# Patient Record
Sex: Male | Born: 1995 | Race: Black or African American | Hispanic: No | Marital: Single | Smoking: Never smoker
Health system: Southern US, Community
[De-identification: ages and names within clinical notes are randomized; demographics above are authoritative.]

## PROBLEM LIST (undated history)

## (undated) DIAGNOSIS — R1011 Right upper quadrant pain: Secondary | ICD-10-CM

## (undated) DIAGNOSIS — F32A Depression, unspecified: Secondary | ICD-10-CM

## (undated) DIAGNOSIS — R55 Syncope and collapse: Secondary | ICD-10-CM

## (undated) DIAGNOSIS — J45909 Unspecified asthma, uncomplicated: Secondary | ICD-10-CM

## (undated) HISTORY — DX: Depression, unspecified: F32.A

## (undated) HISTORY — DX: Right upper quadrant pain: R10.11

## (undated) HISTORY — PX: WISDOM TOOTH EXTRACTION: SHX21

## (undated) HISTORY — DX: Unspecified asthma, uncomplicated: J45.909

## (undated) HISTORY — PX: NO PAST SURGERIES: SHX2092

## (undated) HISTORY — DX: Syncope and collapse: R55

---
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
## 2016-01-17 ENCOUNTER — Ambulatory Visit: Payer: Medicaid Other | Admitting: Internal Medicine

## 2016-01-26 ENCOUNTER — Other Ambulatory Visit: Payer: Self-pay | Admitting: Primary Care

## 2016-01-26 DIAGNOSIS — I498 Other specified cardiac arrhythmias: Secondary | ICD-10-CM

## 2016-01-31 ENCOUNTER — Ambulatory Visit (INDEPENDENT_AMBULATORY_CARE_PROVIDER_SITE_OTHER): Payer: No Typology Code available for payment source | Admitting: Internal Medicine

## 2016-01-31 ENCOUNTER — Encounter: Payer: Self-pay | Admitting: Internal Medicine

## 2016-01-31 VITALS — BP 116/70 | HR 50 | Ht 69.0 in | Wt 203.0 lb

## 2016-01-31 DIAGNOSIS — R079 Chest pain, unspecified: Secondary | ICD-10-CM | POA: Diagnosis not present

## 2016-01-31 NOTE — Progress Notes (Signed)
      HPI Billy Shropshirerevon returns today to followup syncope. 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 When I saw him last, he was having RUQ pain and an U/S of the Gall bladder did not demonstrate any stones. In the interim, he notes a couple of spells where he felt like he might pass out but did not. He would lie down and the episode would pass.  No Known Allergies   Current Outpatient Prescriptions  Medication Sig Dispense Refill  . diclofenac (VOLTAREN) 0.1 % ophthalmic solution 4 (four) times daily.    Marland Kitchen. ibuprofen (ADVIL,MOTRIN) 200 MG tablet Take 400 mg by mouth every 6 (six) hours as needed for mild pain. Reported on 11/02/2015     No current facility-administered medications for this visit.     Past Medical History  Diagnosis Date  . Syncope 10/18/2015  . Right upper quadrant pain 10/18/2015    ROS:   All systems reviewed and negative except as noted in the HPI.   Past Surgical History  Procedure Laterality Date  . No past surgeries       No family history on file.   Social History   Social History  . Marital Status: Single    Spouse Name: N/A  . Number of Children: N/A  . Years of Education: N/A   Occupational History  . Not on file.   Social History Main Topics  . Smoking status: Never Smoker   . Smokeless tobacco: Not on file  . Alcohol Use: No  . Drug Use: No  . Sexual Activity: Not on file   Other Topics Concern  . Not on file   Social History Narrative     Ht 5\' 9"  (1.753 m)  Wt 203 lb (92.08 kg)  BMI 29.96 kg/m2  Physical Exam:  Well appearing NAD HEENT: Unremarkable Neck:  No JVD, no thyromegally Lymphatics:  No adenopathy Back:  No CVA tenderness Lungs:  Clear with no wheezes. HEART:  Regular rate rhythm, no murmurs, no rubs, no clicks Abd:  soft, positive bowel sounds, no organomegally, no rebound, no guarding Ext:  2 plus pulses, no edema, no cyanosis, no clubbing Skin:  No rashes  no nodules Neuro:  CN II through XII intact, motor grossly intact  EKG - NSR with LVH  Assess/Plan: 1. Syncope - he appears to be stable. I discussed the benign nature of his symptoms and recommended he continue his increased salt and fluid. I will see him back in a year. 2. Chest pain - his symptoms are atypical. He will undergo watchful waiting. 3. Right upper quadrant pain - his u/s is notable for no stones. His mother tells me he is pending a CT scan.  Leonia ReevesGregg Carrieanne Turner,M.D.

## 2016-01-31 NOTE — Patient Instructions (Signed)
Medication Instructions:  Your physician recommends that you continue on your current medications as directed. Please refer to the Current Medication list given to you today.   Labwork: None ordered None ordered   Testing/Procedures:  None ordered   Follow-Up:  Your physician wants you to follow-up in: 12 months with Dr Court Joyaylor You will receive a reminder letter in the mail two months in advance. If you don't receive a letter, please call our office to schedule the follow-up appointment.   Any Other Special Instructions Will Be Listed Below (If Applicable).     If you need a refill on your cardiac medications before your next appointment, please call your pharmacy.

## 2016-02-02 ENCOUNTER — Ambulatory Visit
Admission: RE | Admit: 2016-02-02 | Discharge: 2016-02-02 | Disposition: A | Discharge status: Home / Self Care | Payer: No Typology Code available for payment source | Source: Ambulatory Visit | Attending: Primary Care | Admitting: Primary Care

## 2016-02-02 DIAGNOSIS — I498 Other specified cardiac arrhythmias: Secondary | ICD-10-CM

## 2016-02-02 MED ORDER — IOPAMIDOL (ISOVUE-300) INJECTION 61%
125.0000 mL | Freq: Once | INTRAVENOUS | Status: AC | PRN
  Administered 2016-02-02: 125 mL via INTRAVENOUS

## 2016-02-07 ENCOUNTER — Ambulatory Visit (INDEPENDENT_AMBULATORY_CARE_PROVIDER_SITE_OTHER): Payer: No Typology Code available for payment source | Admitting: Internal Medicine

## 2016-02-07 ENCOUNTER — Encounter: Payer: Self-pay | Admitting: Internal Medicine

## 2016-02-07 VITALS — BP 111/56 | HR 57 | Temp 97.6°F | Ht 69.0 in | Wt 203.1 lb

## 2016-02-07 DIAGNOSIS — Z8679 Personal history of other diseases of the circulatory system: Secondary | ICD-10-CM | POA: Diagnosis not present

## 2016-02-07 DIAGNOSIS — R55 Syncope and collapse: Secondary | ICD-10-CM

## 2016-02-07 DIAGNOSIS — R1011 Right upper quadrant pain: Secondary | ICD-10-CM

## 2016-02-07 DIAGNOSIS — Z09 Encounter for follow-up examination after completed treatment for conditions other than malignant neoplasm: Secondary | ICD-10-CM

## 2016-02-07 MED ORDER — NAPROXEN 500 MG PO TABS: 500.0000 mg | ORAL_TABLET | Freq: Two times a day (BID) | ORAL | Status: DC

## 2016-02-07 MED ORDER — DICLOFENAC SODIUM 1 % TD GEL: 2.0000 g | Freq: Four times a day (QID) | TRANSDERMAL | Status: DC

## 2016-02-07 NOTE — Patient Instructions (Signed)
Your back pain seems most consistent with inflammation of the muscles connecting to your ribs along your back and side. Taking a strong dose of anti-inflammatory medications would hopefully reduce the irritation of these muscles and the associated pain.  Take naproxen 500mg  twice daily for at least 2-3 weeks and see if there is significant improvement in your symptoms. It may take a few days to have effect.  You may also continue using topical treatments with heat, ice, or topical voltaren for symptom relief in the meantime.  If your symptoms are not adequately controlled within a few weeks we will need to reassess and may start a different management plan.

## 2016-02-07 NOTE — Progress Notes (Signed)
   Subjective:   Patient ID: Billy Turner male   DOB: 1996/03/28 19 y.o.   MRN: [id number]  HPI: [name] is a 20 y.o. man with a history of right sided chest pain aand vasovagal syncope presenting for evaluation of his continued pain. His symptoms have been present intermittently for about 2-3 years. He denies any associated traumatic event with the onset of his pain. It is exacerbated by postural changes and deep inspiration. It is partially relieved with voltaren gel, ice, wearing a brace, or taking a nap. The pain is not so severe that it prevents daily activities and athletics.  See problem based assessment and plan below for additional details.  Past Medical History  Diagnosis Date  . Syncope 10/18/2015  . Right upper quadrant pain 10/18/2015   Current Outpatient Prescriptions  Medication Sig Dispense Refill  . diclofenac sodium (VOLTAREN) 1 % GEL Apply 2 g topically 4 (four) times daily. As needed. 100 g 2  . naproxen (NAPROSYN) 500 MG tablet Take 1 tablet (500 mg total) by mouth 2 (two) times daily with a meal. 60 tablet 2   No current facility-administered medications for this visit.   No family history on file. Social History   Social History  . Marital Status: Single    Spouse Name: N/A  . Number of Children: N/A  . Years of Education: N/A   Social History Main Topics  . Smoking status: Never Smoker   . Smokeless tobacco: None  . Alcohol Use: No  . Drug Use: No  . Sexual Activity: Not Asked   Other Topics Concern  . None   Social History Narrative   Review of Systems: Review of Systems  Constitutional: Positive for weight loss.  Respiratory: Negative for shortness of breath.   Cardiovascular: Positive for chest pain.  Gastrointestinal: Negative for nausea, vomiting, diarrhea and constipation.  Musculoskeletal: Positive for myalgias. Negative for falls.  Skin: Negative for rash.  Psychiatric/Behavioral: The patient does not have insomnia.       Objective:  Physical Exam: Filed Vitals:   02/07/16 0930  BP: 111/56  Pulse: 57  Temp: 97.6 F (36.4 C)  TempSrc: Oral  Height: 5\' 9"  (1.753 m)  Weight: 203 lb 1.6 oz (92.126 kg)  SpO2: 100%   GENERAL- alert, co-operative, NAD CARDIAC- RRR, no murmurs, rubs or gallops. RESP- CTAB, no wheezes or crackles. CHEST WALL- Tenderness to palpation over right lower costal border from vertebra extending around to midclavicular line, no skin change, no deformity ABDOMEN- Soft, nontender BACK- Right flank tenderness, paraspinal tenderness on right from about T8-L2, no deformity EXTREMITIES- slight tenderness on full active ROM of left knee, strength 5/5 SKIN- Warm, dry, No rash or lesion. PSYCH- Normal mood and affect, appropriate thought content and speech.  Assessment & Plan:

## 2016-02-08 NOTE — Assessment & Plan Note (Addendum)
Assessment: Pain is actually located more over the lower costal border and seems consistent with musculoskeletal pain. Seems very chronic by history. It is readily reproducible on physical exam. There was no tenderness over RUQ of the abdomen on my exam today. The right flank is tender superficially. He has had recent radiography with abdominal CT, chest xray, rib xray earlier this year all showing no abnormalities. He previously did physical therapy and thinks it did not help much and was not interested in referral today.  Plan: Naproxen 500mg  BID schedule 2-3 weeks Voltaren gel topical PRN Recommend continued stretching as tolerable Consider muscle relaxants, PT if symptoms are not improved at follow up

## 2016-02-08 NOTE — Assessment & Plan Note (Signed)
Reports no episodes since November 2016. He is consuming more electrolytes and fluid with exercise than before.

## 2016-02-12 NOTE — Progress Notes (Signed)
Case discussed with Dr. Rice at the time of the visit.  We reviewed the resident's history and exam and pertinent patient test results.  I agree with the assessment, diagnosis, and plan of care documented in the resident's note. 

## 2016-03-28 ENCOUNTER — Ambulatory Visit (INDEPENDENT_AMBULATORY_CARE_PROVIDER_SITE_OTHER): Payer: No Typology Code available for payment source | Admitting: Internal Medicine

## 2016-03-28 ENCOUNTER — Encounter: Payer: Self-pay | Admitting: Internal Medicine

## 2016-03-28 VITALS — BP 112/56 | HR 58 | Temp 98.2°F | Wt 202.5 lb

## 2016-03-28 DIAGNOSIS — M791 Myalgia: Secondary | ICD-10-CM | POA: Diagnosis not present

## 2016-03-28 DIAGNOSIS — M7918 Myalgia, other site: Secondary | ICD-10-CM

## 2016-03-28 NOTE — Patient Instructions (Signed)
Thank you for coming to see me today. It was a pleasure. Today we talked about:   Your ribcage and back pain: I am going to refer you to physical medicine and rehab.  Please also consider doing some alternative treatments such as yoga or tai chi or acupuncture or meditation.  Please follow-up with [place] in 1 year  If you have any questions or concerns, please do not hesitate to call the office at [phone].  Take Care,   Jule Ser, DO

## 2016-03-29 NOTE — Progress Notes (Signed)
Internal Medicine Clinic Attending  Case discussed with Dr. Wallace at the time of the visit.  We reviewed the resident's history and exam and pertinent patient test results.  I agree with the assessment, diagnosis, and plan of care documented in the resident's note.  

## 2016-03-29 NOTE — Assessment & Plan Note (Signed)
Assessment: Patient continues to have pain.  It very much appears to be more MSK related vs RUQ pain Although he did elicit some discomfort on palpation of his RUQ, the pain seemed to be more along his ribs as well as tenderness of the paraspinal muscles in the thoracolumbar region.  He has had a fairly extensive work-up thus far including imaging with abdominal CT, CXR, rib x-ray, abdominal ultrasound this year that did not show an abnormality.  His labwork has also been unremarkable.  He says the Naproxen and Voltaren do not really help.  He will occasionally do the stretching exercises taught to him by physical therapy but states they exacerbate his pain.  He tells me he works in [employer] and is still able to work.  He also continues to be able to play basketball regularly.  Plan: - discussed with patient to continue PT exercises as tolerated - discussed alternative treatment options (i.e. Yoga, Tai-Chi, Acupuncture) - refer to PM&R - d/c naproxen and voltaren as not really helping per patient

## 2016-03-29 NOTE — Progress Notes (Signed)
Patient ID: Billy Turner, male   DOB: 03-May-1996, 20 y.o.   MRN: [id number]   Subjective:   Patient ID: Billy Turner male   DOB: [birth year] 20 y.o.   MRN: [id number]  HPI: [name] is a 20 y.o. man with PMHx as below presenting for follow up of his RUQ pain.  Please see A&P for status of his chronic medical conditions.    Past Medical History  Diagnosis Date  . Syncope 10/18/2015  . Right upper quadrant pain 10/18/2015   No current outpatient prescriptions on file.   No current facility-administered medications for this visit.   No family history on file. Social History   Social History  . Marital Status: Single    Spouse Name: N/A  . Number of Children: N/A  . Years of Education: N/A   Social History Main Topics  . Smoking status: Never Smoker   . Smokeless tobacco: None  . Alcohol Use: No  . Drug Use: No  . Sexual Activity: Not Asked   Other Topics Concern  . None   Social History Narrative   Review of Systems: Review of Systems  Constitutional: Negative for fever and chills.  Respiratory: Negative for cough.   Cardiovascular: Negative for chest pain.  Gastrointestinal: Positive for abdominal pain (RUQ).  Genitourinary: Negative for dysuria and flank pain.  Musculoskeletal: Positive for back pain.  Psychiatric/Behavioral: Negative for depression. The patient is not nervous/anxious.     Objective:  Physical Exam: Filed Vitals:   03/28/16 1426  BP: 112/56  Pulse: 58  Temp: 98.2 F (36.8 C)  TempSrc: Oral  Weight: 202 lb 8 oz (91.853 kg)  SpO2: 100%   Physical Exam  Constitutional: He is oriented to person, place, and time and well-developed, well-nourished, and in no distress.  Pulmonary/Chest: Effort normal.  Abdominal: Soft. Bowel sounds are normal. He exhibits no distension. There is tenderness (mild over RUQ).  Musculoskeletal: Normal range of motion. He exhibits tenderness (he is tender superficially along right ribs as well as  paraspinal tenderness of the thoracolumbar region).  No deformities.  Neurological: He is alert and oriented to person, place, and time.  Skin: Skin is warm and dry.  Psychiatric: Affect normal.    Assessment & Plan:   Case discussed with Dr. Evette Doffing.  Musculoskeletal pain Assessment: Patient continues to have pain.  It very much appears to be more MSK related vs RUQ pain Although he did elicit some discomfort on palpation of his RUQ, the pain seemed to be more along his ribs as well as tenderness of the paraspinal muscles in the thoracolumbar region.  He has had a fairly extensive work-up thus far including imaging with abdominal CT, CXR, rib x-ray, abdominal ultrasound this year that did not show an abnormality.  His labwork has also been unremarkable.  He says the Naproxen and Voltaren do not really help.  He will occasionally do the stretching exercises taught to him by physical therapy but states they exacerbate his pain.  He tells me he works in [employer] and is still able to work.  He also continues to be able to play basketball regularly.  Plan: - discussed with patient to continue PT exercises as tolerated - discussed alternative treatment options (i.e. Yoga, Tai-Chi, Acupuncture) - refer to PM&R - d/c naproxen and voltaren as not really helping per patient

## 2016-04-11 ENCOUNTER — Ambulatory Visit: Payer: No Typology Code available for payment source | Attending: Internal Medicine

## 2016-04-11 DIAGNOSIS — R252 Cramp and spasm: Secondary | ICD-10-CM | POA: Insufficient documentation

## 2016-04-11 DIAGNOSIS — R293 Abnormal posture: Secondary | ICD-10-CM | POA: Insufficient documentation

## 2016-04-11 DIAGNOSIS — M545 Low back pain, unspecified: Secondary | ICD-10-CM

## 2016-04-11 DIAGNOSIS — M546 Pain in thoracic spine: Secondary | ICD-10-CM | POA: Diagnosis present

## 2016-04-11 NOTE — Therapy (Signed)
[facility] [address], [place], [place], [address] Phone: [phone]   Fax:  [phone]  Physical Therapy Evaluation  Patient Details  Name: Billy Turner MRN: [id number] Date of Birth: [birth year] Referring Provider: Jule Ser, MD  Encounter Date: 04/11/2016      PT End of Session - 04/11/16 1211    Visit Number 1   Date for PT Re-Evaluation 06/10/16   PT Start Time 5859   PT Stop Time 1210  Medicaid insurance so evaluation only   PT Time Calculation (min) 25 min   Activity Tolerance Patient tolerated treatment well   Behavior During Therapy [facility] for tasks assessed/performed      Past Medical History  Diagnosis Date  . Syncope 10/18/2015  . Right upper quadrant pain 10/18/2015    Past Surgical History  Procedure Laterality Date  . No past surgeries      There were no vitals filed for this visit.       Subjective Assessment - 04/11/16 1146    Subjective Pt presents to PT with continued complaints of Rt rib cage pain that began 2015 due to injury.  Pt was treated at this clinic and was making steady progress and continues to do HEP for this diangosis.  Pt now with LBP that began 2 weeks ago.  Pt denies any injury related to LBP.     Pertinent History vasovagal syncope- pt passes out with over exertion.     Limitations Standing   How long can you stand comfortably? 30 minutes due to LBP   Diagnostic tests none for low back, nothing recent of ribs.     Patient Stated Goals reduce LBP, stand longer   Currently in Pain? Yes   Pain Score 5    Pain Location Back   Pain Orientation Right;Lower   Pain Descriptors / Indicators Aching   Pain Type Acute pain   Pain Onset 1 to 4 weeks ago   Pain Frequency Constant   Aggravating Factors  standing > 30 minutes, bending down   Pain Relieving Factors laying down   Multiple Pain Sites Yes   Pain Score 4   Pain Location Rib cage   Pain Orientation  Right   Pain Descriptors / Indicators Stabbing   Pain Type Chronic pain   Pain Onset More than a month ago   Pain Frequency Constant   Aggravating Factors  everything   Pain Relieving Factors nothing            [facility] PT Assessment - 04/11/16 0001    Assessment   Medical Diagnosis musculoskeletal pain   Referring Provider wallace, andrew, MD   Onset Date/Surgical Date 11/01/13   Next MD Visit none scheduled   Prior Therapy for Rt rib pain ending3/2017   Precautions   Precautions Other (comment)  pt has syncope related to over exertion   Restrictions   Weight Bearing Restrictions No   Balance Screen   Has the patient fallen in the past 6 months No   Has the patient had a decrease in activity level because of a fear of falling?  No   Is the patient reluctant to leave their home because of a fear of falling?  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   Charity fundraiser Status Within [employer] for tasks assessed   Observation/Other Assessments   Focus on Therapeutic Outcomes (FOTO)  37% limitation   Posture/Postural Control   Posture/Postural Control Postural limitations   ROM / Strength   AROM / PROM / Strength AROM;PROM;Strength   AROM   Overall AROM  Within functional limits for tasks performed   Overall AROM Comments full lumbar and thoracic AROM with Rt lumbar and rib pain at end range sidebending bilaterally   PROM   Overall PROM  Within functional limits for tasks performed   Overall PROM Comments hip PROM is WFLs without pain   Strength   Overall Strength Within functional limits for tasks performed   Overall Strength Comments 5/5 LE strength, 4+/5 bil UE  strength   Palpation   Spinal mobility reduce PA mobiltiy T3-5, unable to assess T6-12 due to hypersensitivity, normal mobility in the lumbar spine without pain   Palpation comment hypersensitive over Rt lateral thoraicc spine.  active trigger points in Rt lattisimus   Ambulation/Gait   Ambulation/Gait Yes   Ambulation/Gait Assistance 7: Independent                           PT Education - 04/11/16 1211    Education provided Yes   Education Details Dry needling info   Person(s) Educated Patient   Methods Handout;Explanation   Comprehension Verbalized understanding          PT Short Term Goals - 04/11/16 1218    PT SHORT TERM GOAL #1   Title be independent in initial HEP   Time 4   Period Weeks   Status New   PT SHORT TERM GOAL #2   Title report a 30% reduction in Rt rib cage pain with activity   Time 4   Period Weeks   Status New   PT SHORT TERM GOAL #3   Title stand and walk for 45 minutes without limitaiton due to LBP   Time 4   Period Weeks   Status New           PT Long Term Goals - 04/11/16 1141    PT LONG TERM GOAL #1   Title be independent in advanced HEP   Time 8   Period Weeks   Status New   PT LONG TERM GOAL #2   Title reduce FOTO to < or = to 30% limitation   Time 8   Period Weeks   Status New   PT LONG TERM GOAL #3   Title stand and walk for 1 hour without limitation due to LBP   Baseline 30 minutes   Time 8   Period Weeks   Status New   PT LONG TERM GOAL #4   Title sit with upright posture at least 50% of the time to reduce stress on spine   Time 8   Period Weeks   Status New   PT LONG TERM GOAL #5   Title report a 60% reduction in thoracic and rib pain with activity   Time 8   Period Weeks   Status New               Plan - 04/11/16 1212    Clinical Impression Statement Pt presents to PT with continued complaints of Rt  thoracic/rib pain that began 10/2013 after injury.  Pt was treated at this clinic for  rib pain and had met all goals. Pt reports that he is still consistent with HEP for rib pain.  Pt with onset of LBP 2 weeks ago (Rt>Lt) without cause.  Pt demonstrates painful lumbar and thoracic AROM, reduced thoracic mobility, tenderness with trigger points over Rt latissimus, and poor seated posture.  FOTO score is 37% limitation.  Walking is limited to 30 minutes due to pain.  Pt will benefit from skilled PT for manual, dry needling, postural strength, core strength progression and modalities for pain.    Rehab Potential Good   PT Frequency 2x / week   PT Duration 8 weeks   PT Treatment/Interventions ADLs/Self Care Home Management;Cryotherapy;Electrical Stimulation;Iontophoresis 83m/ml Dexamethasone;Moist Heat;Therapeutic exercise;Therapeutic activities;Ultrasound;Neuromuscular re-education;Patient/family education;Manual techniques;Taping;Dry needling;Passive range of motion   PT Next Visit Plan Dry needling, PA mobs in thoracic spine, core strength, flexiblity for low back, review thoracic HEP    Consulted and Agree with Plan of Care Patient      Patient will benefit from skilled therapeutic intervention in order to improve the following deficits and impairments:  Pain, Postural dysfunction, Improper body mechanics, Decreased mobility, Decreased activity tolerance, Increased muscle spasms  Visit Diagnosis: Abnormal posture - Plan: PT plan of care cert/re-cert  Pain in thoracic spine - Plan: PT plan of care cert/re-cert  Bilateral low back pain without sciatica - Plan: PT plan of care cert/re-cert  Cramp and spasm - Plan: PT plan of care cert/re-cert     Problem List Patient Active Problem List   Diagnosis Date Noted  . Syncope 10/18/2015  . Musculoskeletal pain 10/18/2015     KSigurd Sos PT 04/11/2016 12:22 PM  [facility] [address] [place] [address]: [phone]  Fax:   [phone] Name: Billy DORVILMRN: [id number] of Birth: [birth year]

## 2016-04-17 ENCOUNTER — Ambulatory Visit: Payer: No Typology Code available for payment source

## 2016-04-17 DIAGNOSIS — R293 Abnormal posture: Secondary | ICD-10-CM

## 2016-04-17 DIAGNOSIS — M545 Low back pain, unspecified: Secondary | ICD-10-CM

## 2016-04-17 DIAGNOSIS — M546 Pain in thoracic spine: Secondary | ICD-10-CM

## 2016-04-17 DIAGNOSIS — R252 Cramp and spasm: Secondary | ICD-10-CM

## 2016-04-17 NOTE — Therapy (Signed)
[facility] [address], [address] [place], [place], [address] Phone: [phone]   Fax:  [phone]  Physical Therapy Treatment  Patient Details  Name: Billy Turner MRN: [id number] Date of Birth: [birth year] Referring Provider: Gwynn Burlywallace, andrew, MD  Encounter Date: 04/17/2016      PT End of Session - 04/17/16 1657    Visit Number 2   Date for PT Re-Evaluation 06/10/16   PT Start Time 1616   PT Stop Time 1657   PT Time Calculation (min) 41 min   Activity Tolerance Patient tolerated treatment well   Behavior During Therapy [facility] for tasks assessed/performed      Past Medical History  Diagnosis Date  . Syncope 10/18/2015  . Right upper quadrant pain 10/18/2015    Past Surgical History  Procedure Laterality Date  . No past surgeries      There were no vitals filed for this visit.      Subjective Assessment - 04/17/16 1616    Subjective Pt with continued Rt rib cage pain and new onset of LBP.  Doing stretches.   Patient Stated Goals reduce LBP, stand longer   Currently in Pain? Yes   Pain Score 4    Pain Location Back   Pain Orientation Right;Lower   Pain Descriptors / Indicators Aching   Pain Type Acute pain   Pain Onset 1 to 4 weeks ago   Pain Frequency Constant   Aggravating Factors  standing >30 minutes, bending down   Pain Relieving Factors laying down   Pain Score 4   Pain Location Rib cage   Pain Orientation Right   Pain Descriptors / Indicators Stabbing   Pain Type Chronic pain   Pain Onset More than a month ago   Pain Frequency Constant   Aggravating Factors  everyting    Pain Relieving Factors nothing                         [facility] Adult PT Treatment/Exercise - 04/17/16 0001    Exercises   Exercises Knee/Hip;Lumbar   Lumbar Exercises: Stretches   Active Hamstring Stretch 3 reps;20 seconds   Single Knee to Chest Stretch 3 reps;20 seconds   Lower Trunk Rotation 3 reps;20  seconds   Lumbar Exercises: Aerobic   UBE (Upper Arm Bike) seated on blue ball: Level 1 x 6 minutes   Modalities   Modalities Moist Heat   Manual Therapy   Manual Therapy Soft tissue mobilization;Joint mobilization   Manual therapy comments thoracic mobs T2-8 grade 2   Soft tissue mobilization soft tissue elontation to Rt lats at lateral rib cage          Trigger Point Dry Needling - 04/17/16 1639    Consent Given? Yes   Education Handout Provided Yes  reviewed pneumothorax precautions   Muscles Treated Upper Body --  Rt lattisimus at lateral ribs distal to scapula.               PT Education - 04/17/16 1644    Education provided Yes   Education Details dry needling info, lumbar flexibility   Person(s) Educated Patient   Methods Explanation;Handout   Comprehension Verbalized understanding;Returned demonstration          PT Short Term Goals - 04/17/16 1618    PT SHORT TERM GOAL #1   Title be independent in initial HEP   Time 4   Period Weeks   Status On-going  PT SHORT TERM GOAL #2   Title report a 30% reduction in Rt rib cage pain with activity   Time 4   Period Weeks   Status On-going           PT Long Term Goals - 04/11/16 1141    PT LONG TERM GOAL #1   Title be independent in advanced HEP   Time 8   Period Weeks   Status New   PT LONG TERM GOAL #2   Title reduce FOTO to < or = to 30% limitation   Time 8   Period Weeks   Status New   PT LONG TERM GOAL #3   Title stand and walk for 1 hour without limitation due to LBP   Baseline 30 minutes   Time 8   Period Weeks   Status New   PT LONG TERM GOAL #4   Title sit with upright posture at least 50% of the time to reduce stress on spine   Time 8   Period Weeks   Status New   PT LONG TERM GOAL #5   Title report a 60% reduction in thoracic and rib pain with activity   Time 8   Period Weeks   Status New               Plan - 04/17/16 1618    Clinical Impression Statement Pt  presents to PT with Rt thoracic/rib pain and LBP.  Pt has only had evaluation.  Pt with tenderness and trigger points over Rt lattisimus and reduced mobility in the lumbar and thoracic spine.  Pt with improved tissue mobility and reduced pain after needling today.  Pt will continue to benefit from skilled PT for manual, thoracic and lumbar flexiblity and mobiltiy, strength and modalities as needed.     Rehab Potential Good   PT Frequency 2x / week   PT Duration 8 weeks   PT Treatment/Interventions ADLs/Self Care Home Management;Cryotherapy;Electrical Stimulation;Iontophoresis /ml Dexamethasone;Moist Heat;Therapeutic exercise;Therapeutic activities;Ultrasound;Neuromuscular re-education;Patient/family education;Manual techniques;Taping;Dry needling;Passive range of motion   PT Next Visit Plan Dry needling, PA mobs in thoracic spine, core strength, flexiblity for low back, review thoracic HEP    Consulted and Agree with Plan of Care Patient      Patient will benefit from skilled therapeutic intervention in order to improve the following deficits and impairments:  Pain, Postural dysfunction, Improper body mechanics, Decreased mobility, Decreased activity tolerance, Increased muscle spasms  Visit Diagnosis: Abnormal posture  Pain in thoracic spine  Bilateral low back pain without sciatica  Cramp and spasm     Problem List Patient Active Problem List   Diagnosis Date Noted  . Syncope 10/18/2015  . Musculoskeletal pain 10/18/2015    Lorrene Reid, PT 04/17/2016 4:59 PM  [facility] [address] [place], [place], [address] Phone: [phone]   Fax:  [phone]  Name: Billy Turner MRN: [id number] Date of Birth: 1996/02/11

## 2016-04-17 NOTE — Patient Instructions (Signed)
Trigger Point Dry Needling  . What is Trigger Point Dry Needling (DN)? o DN is a physical therapy technique used to treat muscle pain and dysfunction. Specifically, DN helps deactivate muscle trigger points (muscle knots).  o A thin filiform needle is used to penetrate the skin and stimulate the underlying trigger point. The goal is for a local twitch response (LTR) to occur and for the trigger point to relax. No medication of any kind is injected during the procedure.   . What Does Trigger Point Dry Needling Feel Like?  o The procedure feels different for each individual patient. Some patients report that they do not actually feel the needle enter the skin and overall the process is not painful. Very mild bleeding may occur. However, many patients feel a deep cramping in the muscle in which the needle was inserted. This is the local twitch response.   Marland Kitchen. How Will I feel after the treatment? o Soreness is normal, and the onset of soreness may not occur for a few hours. Typically this soreness does not last longer than two days.  o Bruising is uncommon, however; ice can be used to decrease any possible bruising.  o In rare cases feeling tired or nauseous after the treatment is normal. In addition, your symptoms may get worse before they get better, this period will typically not last longer than 24 hours.   . What Can I do After My Treatment? o Increase your hydration by drinking more water for the next 24 hours. o You may place ice or heat on the areas treated that have become sore, however, do not use heat on inflamed or bruised areas. Heat often brings more relief post needling. o You can continue your regular activities, but vigorous activity is not recommended initially after the treatment for 24 hours. o DN is best combined with other physical therapy such as strengthening, stretching, and other therapies.      Perform all exercises below:  Hold _20___ seconds. Repeat _3___ times.  Do  __3__ sessions per day. CAUTION: Movement should be gentle, steady and slow.  Knee to Chest  Lying supine, bend involved knee to chest. Perform with each leg.  Copyright  VHI. All rights reserved.  Double Knee to Chest (Flexion)   Gently pull both knees toward chest. Feel stretch in lower back or buttock area. Breathing deeply, Lumbar Rotation: Caudal - Bilateral (Supine)  Feet and knees together, arms outstretched, rotate knees left, turning head in opposite direction, until stretch is felt.      HIP: Hamstrings - Short Sitting   Rest leg on raised surface. Keep knee straight. Lift chest.   [facility] [address], [address] [place], [place] [address] Phone # [phone] Fax [phone]

## 2016-05-02 ENCOUNTER — Ambulatory Visit: Payer: No Typology Code available for payment source | Attending: Internal Medicine | Admitting: Physical Therapy

## 2016-05-02 DIAGNOSIS — R252 Cramp and spasm: Secondary | ICD-10-CM | POA: Diagnosis present

## 2016-05-02 DIAGNOSIS — M546 Pain in thoracic spine: Secondary | ICD-10-CM | POA: Insufficient documentation

## 2016-05-02 DIAGNOSIS — M545 Low back pain, unspecified: Secondary | ICD-10-CM

## 2016-05-02 DIAGNOSIS — R293 Abnormal posture: Secondary | ICD-10-CM | POA: Insufficient documentation

## 2016-05-02 NOTE — Therapy (Signed)
[facility] [address], [address] [place], [place], [address] Phone: [phone]   Fax:  [phone]  Physical Therapy Treatment  Patient Details  Name: Billy Turner MRN: [id number] Date of Birth: Mar 20, 1996 Referring Provider: Gwynn Burlywallace, andrew, MD  Encounter Date: 05/02/2016      PT End of Session - 05/02/16 1320    Visit Number 3   Date for PT Re-Evaluation 06/10/16   PT Start Time 1245   PT Stop Time 1330   PT Time Calculation (min) 45 min   Activity Tolerance Patient tolerated treatment well      Past Medical History  Diagnosis Date  . Syncope 10/18/2015  . Right upper quadrant pain 10/18/2015    Past Surgical History  Procedure Laterality Date  . No past surgeries      There were no vitals filed for this visit.      Subjective Assessment - 05/02/16 1247    Subjective (p) Dry needling helped for 3 days but now back to its usual level.  Arrives 15 min late.   Currently in Pain? (p) Yes   Pain Score (p) 5    Aggravating Factors  (p) standing 30 min;  bending over                         [facility] Adult PT Treatment/Exercise - 05/02/16 0001    Lumbar Exercises: Stretches   Lower Trunk Rotation 3 reps;20 seconds  sidelying over pillow with UE overhead and deep breathing   Standing Side Bend 3 reps  doorway stretch with right UE reach across   Quadruped Mid Back Stretch 2 reps;20 seconds  prayer stretch with bias for right stretch   Modalities   Modalities Electrical Stimulation   Moist Heat Therapy   Number Minutes Moist Heat 15 Minutes   Moist Heat Location --  right ribs/flank   Insurance claims handlerlectrical Stimulation   Electrical Stimulation Location right ribs/flank   Electrical Stimulation Action IFC   Electrical Stimulation Parameters 12 ma 15 min in sidelying   Electrical Stimulation Goals Pain   Manual Therapy   Manual Therapy Joint mobilization   Manual therapy comments pelvic distraction  3x 20 sec grade 3   Joint Mobilization flexion rotation mob 3 x 30 sec grade 3/4   Soft tissue mobilization right QL and thoracic paraspinals          Trigger Point Dry Needling - 05/02/16 1316    Consent Given? Yes   Muscles Treated Upper Body Quadratus Lumborum  right superficial paraspinals. right QL;  inc muscle length                PT Short Term Goals - 05/02/16 1324    PT SHORT TERM GOAL #1   Title be independent in initial HEP   Time 4   Period Weeks   Status On-going   PT SHORT TERM GOAL #2   Title report a 30% reduction in Rt rib cage pain with activity   Time 4   Period Weeks   Status On-going   PT SHORT TERM GOAL #3   Title stand and walk for 45 minutes without limitaiton due to LBP   Time 4   Period Weeks   Status On-going           PT Long Term Goals - 05/02/16 1324    PT LONG TERM GOAL #1   Title be independent in advanced HEP   Time  8   Period Weeks   Status On-going   PT LONG TERM GOAL #2   Title reduce FOTO to < or = to 30% limitation   Time 8   Period Weeks   Status On-going   PT LONG TERM GOAL #3   Title stand and walk for 1 hour without limitation due to LBP   Time 8   Period Weeks   Status On-going   PT LONG TERM GOAL #4   Title sit with upright posture at least 50% of the time to reduce stress on spine   Time 8   Period Weeks   Status On-going   PT LONG TERM GOAL #5   Title report a 60% reduction in thoracic and rib pain with activity   Time 8   Period Weeks   Status On-going               Plan - 05/02/16 1321    Clinical Impression Statement The patient arrives 15 min late.  He reports temporary relief from first time dry needling last visit.  He is tender points in right QL (quadruatus lumborum) as well as right thoracic paraspinals.  Improved soft tissue length following interventions.  Therapist closely monitoring response with all.     PT Next Visit Plan assess response to Dry needling #2, PA mobs in  thoracic spine, core strength, flexiblity for low back, review thoracic HEP       Patient will benefit from skilled therapeutic intervention in order to improve the following deficits and impairments:     Visit Diagnosis: Abnormal posture  Pain in thoracic spine  Bilateral low back pain without sciatica  Cramp and spasm     Problem List Patient Active Problem List   Diagnosis Date Noted  . Syncope 10/18/2015  . Musculoskeletal pain 10/18/2015    Lavinia SharpsStacy Simpson, PT 05/02/2016 1:26 PM Phone: [phone] Fax: [phone]  Vivien PrestoSimpson, Stacy C 05/02/2016, 1:25 PM  [facility] [address], [address] [place], [place], [address] Phone: [phone]   Fax:  [phone]  Name: Billy Turner MRN: [id number] Date of Birth: [birth year]

## 2016-05-06 ENCOUNTER — Ambulatory Visit: Payer: No Typology Code available for payment source

## 2016-05-06 DIAGNOSIS — M545 Low back pain, unspecified: Secondary | ICD-10-CM

## 2016-05-06 DIAGNOSIS — R252 Cramp and spasm: Secondary | ICD-10-CM

## 2016-05-06 DIAGNOSIS — M546 Pain in thoracic spine: Secondary | ICD-10-CM

## 2016-05-06 DIAGNOSIS — R293 Abnormal posture: Secondary | ICD-10-CM

## 2016-05-06 NOTE — Therapy (Signed)
[facility] [address], [address] [place], [place], [address] Phone: [phone]   Fax:  [phone]  Physical Therapy Treatment  Patient Details  Name: Billy Turner MRN: [id number] Date of Birth: [birth year] Referring Provider: Gwynn Burlywallace, andrew, MD  Encounter Date: 05/06/2016      PT End of Session - 05/06/16 1219    Visit Number 4   Date for PT Re-Evaluation 06/10/16   PT Start Time 1141   PT Stop Time 1237   PT Time Calculation (min) 56 min   Activity Tolerance Patient tolerated treatment well   Behavior During Therapy [facility] for tasks assessed/performed      Past Medical History  Diagnosis Date  . Syncope 10/18/2015  . Right upper quadrant pain 10/18/2015    Past Surgical History  Procedure Laterality Date  . No past surgeries      There were no vitals filed for this visit.      Subjective Assessment - 05/06/16 1149    Subjective No change in pain since the start of care.  Felt a little better for 1.5 days after dry needling last session but then pain returned to the same level.   Patient Stated Goals reduce LBP, stand longer   Currently in Pain? Yes   Pain Score 5    Pain Location Back   Pain Orientation Right;Lower;Mid   Pain Descriptors / Indicators Aching   Pain Type Acute pain   Pain Onset 1 to 4 weeks ago   Pain Frequency Constant   Aggravating Factors  sitting, standing >30 minutes, bending down   Pain Relieving Factors laying down                         [facility] Adult PT Treatment/Exercise - 05/06/16 0001    Lumbar Exercises: Stretches   Active Hamstring Stretch 3 reps;20 seconds   Single Knee to Chest Stretch 3 reps;20 seconds   Lower Trunk Rotation 3 reps;20 seconds  sidelying over pillow with UE overhead and deep breathing   Standing Side Bend 3 reps  doorway stretch with right UE reach across   Quadruped Mid Back Stretch 2 reps;20 seconds  prayer stretch with bias for  right stretch   Lumbar Exercises: Aerobic   UBE (Upper Arm Bike) seated on blue ball: Level 1 x 6 minutes   Lumbar Exercises: Supine   Bridge 20 reps;5 seconds   Modalities   Modalities Electrical Stimulation;Moist Heat   Moist Heat Therapy   Number Minutes Moist Heat 15 Minutes   Moist Heat Location --  right ribs/flank   Insurance claims handlerlectrical Stimulation   Electrical Stimulation Location right ribs/flank   Electrical Stimulation Action IFC   Electrical Stimulation Parameters 15 minutes   Electrical Stimulation Goals Pain   Manual Therapy   Manual Therapy Joint mobilization   Joint Mobilization PA mobs and rib mobs T4-7 grade 2  hypersensitive to mobs today   Soft tissue mobilization right QL and thoracic paraspinals  hypersensitive to mobilization                   PT Short Term Goals - 05/06/16 1151    PT SHORT TERM GOAL #1   Title be independent in initial HEP   Status Achieved   PT SHORT TERM GOAL #2   Title report a 30% reduction in Rt rib cage pain with activity   Time 4   Period Weeks   Status On-going  PT SHORT TERM GOAL #3   Title stand and walk for 45 minutes without limitaiton due to LBP   Status Achieved           PT Long Term Goals - 05/02/16 1324    PT LONG TERM GOAL #1   Title be independent in advanced HEP   Time 8   Period Weeks   Status On-going   PT LONG TERM GOAL #2   Title reduce FOTO to < or = to 30% limitation   Time 8   Period Weeks   Status On-going   PT LONG TERM GOAL #3   Title stand and walk for 1 hour without limitation due to LBP   Time 8   Period Weeks   Status On-going   PT LONG TERM GOAL #4   Title sit with upright posture at least 50% of the time to reduce stress on spine   Time 8   Period Weeks   Status On-going   PT LONG TERM GOAL #5   Title report a 60% reduction in thoracic and rib pain with activity   Time 8   Period Weeks   Status On-going               Plan - 05/06/16 1151    Clinical Impression  Statement Pt denies any significant change in symptoms since the start of care.  Pt reports that he feels better for a day or 2 after dry needling and then pain returns to the same level.  Pt with tender points in Rt thoracic spine and QL today.  Reduced mobilty of the Rt ribs and thoracic spine. Pt hypersensitive to all manual therapy today.  Pt tolerated all flexibility and strength exercises in the clinic without limitation.  Pt will benefit from skilled PT for core strength, flexiblity, thoracic strength, mobs and manual .     Rehab Potential Good   PT Frequency 2x / week   PT Duration 8 weeks   PT Treatment/Interventions ADLs/Self Care Home Management;Cryotherapy;Electrical Stimulation;Iontophoresis /ml Dexamethasone;Moist Heat;Therapeutic exercise;Therapeutic activities;Ultrasound;Neuromuscular re-education;Patient/family education;Manual techniques;Taping;Dry needling;Passive range of motion   PT Next Visit Plan Dry needling again to Rt thoracic/QL, PA mobs in thoracic spine, core strength, flexiblity for low back   Consulted and Agree with Plan of Care Patient      Patient will benefit from skilled therapeutic intervention in order to improve the following deficits and impairments:  Pain, Postural dysfunction, Improper body mechanics, Decreased mobility, Decreased activity tolerance, Increased muscle spasms  Visit Diagnosis: Abnormal posture  Pain in thoracic spine  Bilateral low back pain without sciatica  Cramp and spasm     Problem List Patient Active Problem List   Diagnosis Date Noted  . Syncope 10/18/2015  . Musculoskeletal pain 10/18/2015    Lorrene Reid, PT 05/06/2016 12:20 PM   [facility] [address] [place], [place], [address] Phone: [phone]   Fax:  [phone]  Name: Billy Turner MRN: [id number] Date of Birth: Dec 14, 1995

## 2016-05-09 ENCOUNTER — Ambulatory Visit: Payer: No Typology Code available for payment source

## 2016-05-09 DIAGNOSIS — M545 Low back pain, unspecified: Secondary | ICD-10-CM

## 2016-05-09 DIAGNOSIS — M546 Pain in thoracic spine: Secondary | ICD-10-CM

## 2016-05-09 DIAGNOSIS — R293 Abnormal posture: Secondary | ICD-10-CM | POA: Diagnosis not present

## 2016-05-09 NOTE — Patient Instructions (Signed)
Arm / Leg Lift: Opposite (Prone)   Lift right leg and opposite arm ____ inches from floor, keeping knee locked. Repeat __10__ times per set. Do __1-2__ sets per session. Do __1__ sessions per day.   Straight Leg Raise (Prone)   Abdomen and head supported, keep left knee locked and raise leg at hip. Avoid arching low back. Repeat _10___ times per set. Do _1-2___ sets per session. Do _2___ sessions per day.      Straight Arm Lift: Prone   Arm straight out STRAIGHT Keeping thumb up, lift arm. Keep pelvis still. Do 10 __ times, 1-2 sets, __1_ times per day.   [facility] [address], [address] [place], [place] [address] Phone # [phone] Fax [phone] 

## 2016-05-09 NOTE — Therapy (Signed)
[facility] [address] [place], [place], [address] Phone: [phone]   Fax:  [phone]  Physical Therapy Treatment  Patient Details  Name: Billy Turner MRN: [id number] Date of Birth: 1996/05/29 Referring Provider: Gwynn Burly, MD  Encounter Date: 05/09/2016      Billy Turner End of Session - 05/09/16 1438    Visit Number 5   Date for Billy Turner Re-Evaluation 06/10/16   Billy Turner Start Time 1400   Billy Turner Stop Time 1501   Billy Turner Time Calculation (min) 61 min   Activity Tolerance Patient tolerated treatment well   Behavior During Therapy [facility] for tasks assessed/performed      Past Medical History  Diagnosis Date  . Syncope 10/18/2015  . Right upper quadrant pain 10/18/2015    Past Surgical History  Procedure Laterality Date  . No past surgeries      There were no vitals filed for this visit.      Subjective Assessment - 05/09/16 1408    Subjective More LBP today vs thoracic pain.     Currently in Pain? Yes   Pain Score 4    Pain Location Back   Pain Orientation Left;Right;Lower   Pain Descriptors / Indicators Aching   Pain Type Acute pain   Pain Onset 1 to 4 weeks ago   Pain Frequency Constant                         [facility] Adult Billy Turner Treatment/Exercise - 05/09/16 0001    Lumbar Exercises: Stretches   Active Hamstring Stretch 3 reps;20 seconds  supine with strap   Single Knee to Chest Stretch 3 reps;20 seconds   Lower Trunk Rotation 3 reps;20 seconds  sidelying over pillow with UE overhead and deep breathing   Quadruped Mid Back Stretch 2 reps;20 seconds  prayer stretch with bias for right stretch   Lumbar Exercises: Aerobic   UBE (Upper Arm Bike) seated on blue ball: Level 1 x 6 minutes   Lumbar Exercises: Supine   Bridge 20 reps;5 seconds  with legs on green ball   Lumbar Exercises: Prone   Single Arm Raise 20 reps;Left;Right   Straight Leg Raise 20 reps   Opposite Arm/Leg Raise 20 reps   Modalities   Modalities Electrical Stimulation;Ultrasound   Moist Heat Therapy   Number Minutes Moist Heat 15 Minutes   Moist Heat Location Lumbar Spine   Electrical Stimulation   Electrical Stimulation Location bil. lumbar paraspinals   Electrical Stimulation Action IFC   Electrical Stimulation Parameters 15 minutes   Electrical Stimulation Goals Pain   Ultrasound   Ultrasound Location bil. lumbar paraspinals   Ultrasound Parameters 1.2 w/cm2 cont x 6 minutes   Ultrasound Goals Pain                Billy Turner Education - 05/09/16 1434    Education provided Yes   Education Details HEP: prone arm and leg raises   Person(s) Educated Patient   Methods Explanation;Handout   Comprehension Verbalized understanding;Returned demonstration          Billy Turner Short Term Goals - 05/06/16 1151    Billy Turner SHORT TERM GOAL #1   Title be independent in initial HEP   Status Achieved   Billy Turner SHORT TERM GOAL #2   Title report a 30% reduction in Rt rib cage pain with activity   Time 4   Period Weeks   Status On-going   Billy Turner SHORT TERM GOAL #3  Title stand and walk for 45 minutes without limitaiton due to LBP   Status Achieved           Billy Turner Long Term Goals - 05/02/16 1324    Billy Turner LONG TERM GOAL #1   Title be independent in advanced HEP   Time 8   Period Weeks   Status On-going   Billy Turner LONG TERM GOAL #2   Title reduce FOTO to < or = to 30% limitation   Time 8   Period Weeks   Status On-going   Billy Turner LONG TERM GOAL #3   Title stand and walk for 1 hour without limitation due to LBP   Time 8   Period Weeks   Status On-going   Billy Turner LONG TERM GOAL #4   Title sit with upright posture at least 50% of the time to reduce stress on spine   Time 8   Period Weeks   Status On-going   Billy Turner LONG TERM GOAL #5   Title report a 60% reduction in thoracic and rib pain with activity   Time 8   Period Weeks   Status On-going               Plan - 05/09/16 1441    Clinical Impression Statement Billy Turner with  increased LBP today and Rt rib pain feels better.  Billy Turner was able to perform all exercises in clinic today without difficulty. Billy Turner with continued hypersensitivity to touch over Rt ribs, thoracic spine and lumbar spine.  Billy Turner will continue to benefit from skilled Billy Turner for strength, flexiblity, mobility and modalities PRN.   Rehab Potential Good   Billy Turner Frequency 2x / week   Billy Turner Duration 8 weeks   Billy Turner Treatment/Interventions ADLs/Self Care Home Management;Cryotherapy;Electrical Stimulation;Iontophoresis 4mg /ml Dexamethasone;Moist Heat;Therapeutic exercise;Therapeutic activities;Ultrasound;Neuromuscular re-education;Patient/family education;Manual techniques;Taping;Dry needling;Passive range of motion   Billy Turner Next Visit Plan Continue ultrasound if helpful, postural strength, core strength, flexiblity for low back   Consulted and Agree with Plan of Care Patient      Patient will benefit from skilled therapeutic intervention in order to improve the following deficits and impairments:  Pain, Postural dysfunction, Improper body mechanics, Decreased mobility, Decreased activity tolerance, Increased muscle spasms  Visit Diagnosis: Abnormal posture  Pain in thoracic spine  Bilateral low back pain without sciatica     Problem List Patient Active Problem List   Diagnosis Date Noted  . Syncope 10/18/2015  . Musculoskeletal pain 10/18/2015     Billy Turner, Billy Turner 05/09/2016 2:50 PM  [facility] [address], [address] [place], [place], [address] Phone: [phone]   Fax:  [phone]  Name: Billy Turner MRN: [id number] Date of Birth: 04-22-1996

## 2016-05-13 ENCOUNTER — Ambulatory Visit: Payer: No Typology Code available for payment source

## 2016-05-13 DIAGNOSIS — M545 Low back pain, unspecified: Secondary | ICD-10-CM

## 2016-05-13 DIAGNOSIS — M546 Pain in thoracic spine: Secondary | ICD-10-CM

## 2016-05-13 DIAGNOSIS — R293 Abnormal posture: Secondary | ICD-10-CM

## 2016-05-13 DIAGNOSIS — R252 Cramp and spasm: Secondary | ICD-10-CM

## 2016-05-13 NOTE — Therapy (Addendum)
[facility] [address], [address] [place], [place], [address] Phone: [phone]   Fax:  [phone]  Physical Therapy Treatment  Patient Details  Name: Billy Turner MRN: [id number] Date of Birth: 1996-06-20 Referring Provider: Gwynn Burlywallace, andrew, MD  Encounter Date: 05/13/2016      PT End of Session - 05/13/16 1239    Visit Number 6   Date for PT Re-Evaluation 06/10/16   Authorization Type Medicaid   Authorization Time Period 03/29/16-06/07/16   Authorization - Visit Number 5   Authorization - Number of Visits 16   PT Start Time 1145   PT Stop Time 1250   PT Time Calculation (min) 65 min   Activity Tolerance Patient tolerated treatment well   Behavior During Therapy [name] for tasks assessed/performed      Past Medical History  Diagnosis Date  . Syncope 10/18/2015  . Right upper quadrant pain 10/18/2015    Past Surgical History  Procedure Laterality Date  . No past surgeries      There were no vitals filed for this visit.      Subjective Assessment - 05/13/16 1154    Subjective Ultrasound helped last session.   Currently in Pain? Yes   Pain Score 5    Pain Location Back   Pain Orientation Right;Left;Lower   Pain Descriptors / Indicators Aching   Pain Type Acute pain   Pain Onset 1 to 4 weeks ago   Pain Frequency Constant   Aggravating Factors  sitting, standing > 30 minutes, bending down   Pain Relieving Factors rest, ultrasound, laying down                         [facility] Adult PT Treatment/Exercise - 05/13/16 0001    Lumbar Exercises: Stretches   Active Hamstring Stretch 3 reps;20 seconds  supine with strap   Single Knee to Chest Stretch 3 reps;20 seconds   Lower Trunk Rotation 3 reps;20 seconds  sidelying over pillow with UE overhead and deep breathing   Quadruped Mid Back Stretch 2 reps;20 seconds  prayer stretch with bias for right stretch   Lumbar Exercises: Aerobic   UBE (Upper  Arm Bike) seated on blue ball: Level 1 x 8  minutes (4/4)   Lumbar Exercises: Supine   Bridge 20 reps;5 seconds  with legs on green ball   Other Supine Lumbar Exercises on foam roll for decompression: blue theraband horizontal abduction and D2 2x10   Lumbar Exercises: Prone   Single Arm Raise 20 reps;Left;Right   Straight Leg Raise 20 reps   Opposite Arm/Leg Raise 20 reps   Modalities   Modalities Electrical Stimulation;Moist Heat;Ultrasound   Moist Heat Therapy   Number Minutes Moist Heat 15 Minutes   Moist Heat Location Lumbar Spine   Electrical Stimulation   Electrical Stimulation Location bil. lumbar paraspinals   Electrical Stimulation Action IFC   Electrical Stimulation Parameters 15 minutes   Electrical Stimulation Goals Pain   Ultrasound   Ultrasound Location bil lumbar paraspinals   Ultrasound Parameters 1.2 w/cm2 cont x 6 minutes   Ultrasound Goals Pain                  PT Short Term Goals - 05/13/16 1202    PT SHORT TERM GOAL #1   Title be independent in initial HEP   Status Achieved   PT SHORT TERM GOAL #2   Title report a 30% reduction in Rt rib cage  pain with activity   Time 4   Period Weeks   Status On-going   PT SHORT TERM GOAL #3   Title stand and walk for 45 minutes without limitaiton due to LBP   Status Achieved           PT Long Term Goals - 05/02/16 1324    PT LONG TERM GOAL #1   Title be independent in advanced HEP   Time 8   Period Weeks   Status On-going   PT LONG TERM GOAL #2   Title reduce FOTO to < or = to 30% limitation   Time 8   Period Weeks   Status On-going   PT LONG TERM GOAL #3   Title stand and walk for 1 hour without limitation due to LBP   Time 8   Period Weeks   Status On-going   PT LONG TERM GOAL #4   Title sit with upright posture at least 50% of the time to reduce stress on spine   Time 8   Period Weeks   Status On-going   PT LONG TERM GOAL #5   Title report a 60% reduction in thoracic and rib pain  with activity   Time 8   Period Weeks   Status On-going               Plan - 05/13/16 1203    Clinical Impression Statement Pt reports 20% overall improvement in symptoms since the start of care.  Pt played basketball over the weekend and reports that his pain in Rt rib and low back region was 7/10.  Pt is independent and compliant with HEP.  Pt with hypersensitivity to manual therapy so not able to tolerate.  Pt will continue to benefit from skilled PT for strength, flexiblity and modaliteis PRN.   Rehab Potential Good   PT Frequency 2x / week   PT Duration 8 weeks   PT Treatment/Interventions ADLs/Self Care Home Management;Cryotherapy;Electrical Stimulation;Iontophoresis /ml Dexamethasone;Moist Heat;Therapeutic exercise;Therapeutic activities;Ultrasound;Neuromuscular re-education;Patient/family education;Manual techniques;Taping;Dry needling;Passive range of motion   PT Next Visit Plan Continue ultrasound if helpful, postural strength, core strength, flexiblity for low back   Consulted and Agree with Plan of Care Patient      Patient will benefit from skilled therapeutic intervention in order to improve the following deficits and impairments:  Pain, Postural dysfunction, Improper body mechanics, Decreased mobility, Decreased activity tolerance, Increased muscle spasms  Visit Diagnosis: Pain in thoracic spine  Abnormal posture  Bilateral low back pain without sciatica  Posture abnormality  Cramp and spasm     Problem List Patient Active Problem List   Diagnosis Date Noted  . Syncope 10/18/2015  . Musculoskeletal pain 10/18/2015     Lorrene Reid, PT 05/13/2016 12:42 PM  [facility] [address] [place], [place], [address] Phone: [phone]   Fax:  [phone]  Name: Billy Turner MRN: [id number] Date of Birth: 06/20/1996

## 2016-05-16 ENCOUNTER — Ambulatory Visit: Payer: No Typology Code available for payment source

## 2016-05-16 DIAGNOSIS — R293 Abnormal posture: Secondary | ICD-10-CM | POA: Diagnosis not present

## 2016-05-16 DIAGNOSIS — M546 Pain in thoracic spine: Secondary | ICD-10-CM

## 2016-05-16 DIAGNOSIS — M545 Low back pain, unspecified: Secondary | ICD-10-CM

## 2016-05-16 NOTE — Therapy (Addendum)
[facility] [address], [place], [place], [address] Phone: [phone]   Fax:  [phone]  Physical Therapy Treatment  Patient Details  Name: Billy Turner MRN: [id number] Date of Birth: 1996/10/25 Referring Provider: Jule Ser, MD  Encounter Date: 05/16/2016      PT End of Session - 05/16/16 1305    Visit Number 7   Date for PT Re-Evaluation 06/10/16   Authorization Type Medicaid   Authorization Time Period 03/29/16-06/07/16   Authorization - Visit Number 6   Authorization - Number of Visits 16   PT Start Time 1230   PT Stop Time 1318   PT Time Calculation (min) 48 min   Activity Tolerance Patient tolerated treatment well   Behavior During Therapy [facility] for tasks assessed/performed      Past Medical History  Diagnosis Date  . Syncope 10/18/2015  . Right upper quadrant pain 10/18/2015    Past Surgical History  Procedure Laterality Date  . No past surgeries      There were no vitals filed for this visit.      Subjective Assessment - 05/16/16 1239    Subjective Ultrasound helped last session.  Stretching is helping   Currently in Pain? Yes   Pain Score 3    Pain Location Back   Pain Orientation Right;Left;Lower   Pain Descriptors / Indicators Aching   Pain Type Acute pain   Pain Onset 1 to 4 weeks ago   Pain Frequency Constant   Aggravating Factors  sitting, standing> 30 minutes, bending down   Pain Relieving Factors rest, ultrasound, laying down                         [facility] Adult PT Treatment/Exercise - 05/16/16 0001    Lumbar Exercises: Stretches   Active Hamstring Stretch 3 reps;20 seconds  supine with strap   Single Knee to Chest Stretch 3 reps;20 seconds   Lower Trunk Rotation 3 reps;20 seconds  sidelying over pillow with UE overhead and deep breathing   Quadruped Mid Back Stretch 2 reps;20 seconds  prayer stretch with bias for right stretch   Lumbar Exercises:  Aerobic   UBE (Upper Arm Bike) seated on blue ball: Level 1 x 8  minutes (4/4)   Lumbar Exercises: Supine   Bridge 20 reps;5 seconds  with legs on green ball   Bridge Limitations bridge with ball squeeze 2x10   Other Supine Lumbar Exercises on foam roll for decompression: blue theraband horizontal abduction and D2 2x10   Modalities   Modalities Electrical Stimulation   Moist Heat Therapy   Number Minutes Moist Heat 15 Minutes   Moist Heat Location Lumbar Spine   Electrical Stimulation   Electrical Stimulation Location bil. lumbar paraspinals   Electrical Stimulation Action IFC   Electrical Stimulation Parameters 15 minutes   Electrical Stimulation Goals Pain                  PT Short Term Goals - 05/13/16 1202    PT SHORT TERM GOAL #1   Title be independent in initial HEP   Status Achieved   PT SHORT TERM GOAL #2   Title report a 30% reduction in Rt rib cage pain with activity   Time 4   Period Weeks   Status On-going   PT SHORT TERM GOAL #3   Title stand and walk for 45 minutes without limitaiton due to LBP   Status Achieved  PT Long Term Goals - 05/02/16 1324    PT LONG TERM GOAL #1   Title be independent in advanced HEP   Time 8   Period Weeks   Status On-going   PT LONG TERM GOAL #2   Title reduce FOTO to < or = to 30% limitation   Time 8   Period Weeks   Status On-going   PT LONG TERM GOAL #3   Title stand and walk for 1 hour without limitation due to LBP   Time 8   Period Weeks   Status On-going   PT LONG TERM GOAL #4   Title sit with upright posture at least 50% of the time to reduce stress on spine   Time 8   Period Weeks   Status On-going   PT LONG TERM GOAL #5   Title report a 60% reduction in thoracic and rib pain with activity   Time 8   Period Weeks   Status On-going               Plan - 05/16/16 1306    Clinical Impression Statement Pt with reduced pain overall today and feels like the stretches have been  helping.  Pt is independent and compliant with HEP.  Pt with weakness and stiffness in the thoracic and lumbar spine.  Pt will continue to benefit from skilled PT for strength, flexiblity and modalities PRN   Rehab Potential Good   PT Frequency 2x / week   PT Duration 8 weeks   PT Treatment/Interventions ADLs/Self Care Home Management;Cryotherapy;Electrical Stimulation;Iontophoresis 9m/ml Dexamethasone;Moist Heat;Therapeutic exercise;Therapeutic activities;Ultrasound;Neuromuscular re-education;Patient/family education;Manual techniques;Taping;Dry needling;Passive range of motion   PT Next Visit Plan ostural strength, core strength, flexiblity for low back   Consulted and Agree with Plan of Care Patient      Patient will benefit from skilled therapeutic intervention in order to improve the following deficits and impairments:  Pain, Postural dysfunction, Improper body mechanics, Decreased mobility, Decreased activity tolerance, Increased muscle spasms  Visit Diagnosis: Pain in thoracic spine  Abnormal posture  Bilateral low back pain without sciatica     Problem List Patient Active Problem List   Diagnosis Date Noted  . Syncope 10/18/2015  . Musculoskeletal pain 10/18/2015     KSigurd Sos PT 05/16/2016 1:08 PM PHYSICAL THERAPY DISCHARGE SUMMARY  Visits from Start of Care: 7  Current functional level related to goals / functional outcomes: Pt attended 7 PT session and then didn't return to PT.  See above for most current status.     Remaining deficits: See above   Education / Equipment:  HEP, posture Plan: Patient agrees to discharge.  Patient goals were partially met. Patient is being discharged due to not returning since the last visit.  ?????        KSigurd Sos PT 06/19/16 7:47 AM  [facility] [address] [place] [address]Phone: [phone]  Fax:  [phone] Name: Billy SELLENMRN: [id number] of Birth: 901/05/1996

## 2016-10-17 ENCOUNTER — Emergency Department (HOSPITAL_COMMUNITY)
Admission: EM | Admit: 2016-10-17 | Discharge: 2016-10-17 | Disposition: A | Discharge status: Home / Self Care | Payer: No Typology Code available for payment source | Attending: Emergency Medicine | Admitting: Emergency Medicine

## 2016-10-17 ENCOUNTER — Encounter (HOSPITAL_COMMUNITY): Payer: Self-pay

## 2016-10-17 DIAGNOSIS — R55 Syncope and collapse: Secondary | ICD-10-CM | POA: Insufficient documentation

## 2016-10-17 DIAGNOSIS — R001 Bradycardia, unspecified: Secondary | ICD-10-CM | POA: Insufficient documentation

## 2016-10-17 LAB — I-STAT CHEM 8, ED
BUN: 7 mg/dL (ref 6–20)
CHLORIDE: 103 mmol/L (ref 101–111)
Calcium, Ion: 1.23 mmol/L (ref 1.15–1.40)
Creatinine, Ser: 0.9 mg/dL (ref 0.61–1.24)
GLUCOSE: 94 mg/dL (ref 65–99)
HCT: 40 % (ref 39.0–52.0)
Hemoglobin: 13.6 g/dL (ref 13.0–17.0)
POTASSIUM: 4.1 mmol/L (ref 3.5–5.1)
Sodium: 141 mmol/L (ref 135–145)
TCO2: 25 mmol/L (ref 0–100)

## 2016-10-17 NOTE — Discharge Instructions (Signed)
It was our pleasure to provide your ER care today - we hope that you feel better.  Rest. Drink adequate fluids.  If you begin to feel faint, sit or lie down immediately, so as to avoid passing out and risking fall/injury.   Follow up with your doctor/cardiologist in the coming week.  Return to ER if worse, trouble breathing, persistently fast/racing heartbeat, recurrent fainting, other concern.

## 2016-10-17 NOTE — ED Notes (Signed)
Pt stable, understands discharge instructions, and reasons for return.   

## 2016-10-17 NOTE — ED Provider Notes (Signed)
[facility]-EMERGENCY DEPT Provider Note   CSN: [id number] Arrival date & time: 10/17/16  2207     History   Chief Complaint Chief Complaint  Patient presents with  . Near Syncope    HPI Billy Turner is a 20 y.o. male.  Patient with hx syncope, presents from church w family after a syncopal episode.  Was standing up, began feeling faint/lightheaded, felt nauseated, and the felt as if would faint.  Father caught him and assisted to lay down, so no trauma/injury. Patient felt fine earlier today, had eaten/drank normally. No recent blood loss. Denies associated chest pain or discomfort. No palpitations.  No recent uri c/o. No fevers. No vomiting or diarrhea. No recent new meds/med use. Has been seen in ED, and by pcp and cardiology for same in past - no specific dx made, apart from possible vasovagal syncope. No hx dysrhythmia.    The history is provided by the patient.  Near Syncope  Pertinent negatives include no chest pain, no abdominal pain, no headaches and no shortness of breath.    Past Medical History:  Diagnosis Date  . Right upper quadrant pain 10/18/2015  . Syncope 10/18/2015    Patient Active Problem List   Diagnosis Date Noted  . Syncope 10/18/2015  . Musculoskeletal pain 10/18/2015    Past Surgical History:  Procedure Laterality Date  . NO PAST SURGERIES         Home Medications    Prior to Admission medications   Medication Sig Start Date End Date Taking? Authorizing Provider  naproxen sodium (ANAPROX) 220 MG tablet Take 220 mg by mouth 2 (two) times daily with a meal.    Historical Provider, MD    Family History History reviewed. No pertinent family history.  Social History Social History  Substance Use Topics  . Smoking status: Never Smoker  . Smokeless tobacco: Never Used  . Alcohol use No     Allergies   Patient has no known allergies.   Review of Systems Review of Systems  Constitutional: Negative for fever.  HENT: Negative for  sore throat.   Eyes: Negative for visual disturbance.  Respiratory: Negative for shortness of breath.   Cardiovascular: Positive for near-syncope. Negative for chest pain and leg swelling.  Gastrointestinal: Positive for nausea. Negative for abdominal pain and vomiting.  Endocrine: Negative for polyuria.  Genitourinary: Negative for flank pain.  Musculoskeletal: Negative for neck pain.  Skin: Negative for rash.  Neurological: Negative for headaches.  Psychiatric/Behavioral: Negative for confusion.     Physical Exam Updated Vital Signs BP 120/65 (BP Location: Right Arm)   Pulse (!) 49   Temp 98.4 F (36.9 C) (Oral)   Resp 21   Ht 5\' 7"  (1.702 m)   Wt 86.2 kg   SpO2 100%   BMI 29.76 kg/m   Physical Exam  Constitutional: He appears well-developed and well-nourished. No distress.  HENT:  Head: Atraumatic.  Mouth/Throat: Oropharynx is clear and moist.  Eyes: Conjunctivae are normal. Pupils are equal, round, and reactive to light.  Neck: Neck supple. No tracheal deviation present. No thyromegaly present.  Cardiovascular: Regular rhythm, normal heart sounds and intact distal pulses.  Exam reveals no gallop and no friction rub.   No murmur heard. Mildly bradycardic, hr 50's.   Pulmonary/Chest: Effort normal and breath sounds normal. No accessory muscle usage. No respiratory distress.  Abdominal: Soft. He exhibits no distension. There is no tenderness.  Musculoskeletal: He exhibits no edema or tenderness.  Neurological: He is alert.  Skin: Skin is warm and dry. He is not diaphoretic.  Psychiatric: He has a normal mood and affect.  Nursing note and vitals reviewed.    ED Treatments / Results  Labs (all labs ordered are listed, but only abnormal results are displayed) Results for orders placed or performed during the hospital encounter of 10/17/16  I-stat chem 8, ed  Result Value Ref Range   Sodium 141 135 - 145 mmol/L   Potassium 4.1 3.5 - 5.1 mmol/L   Chloride 103 101 -  111 mmol/L   BUN 7 6 - 20 mg/dL   Creatinine, Ser 7.820.90 0.61 - 1.24 mg/dL   Glucose, Bld 94 65 - 99 mg/dL   Calcium, Ion 9.561.23 2.131.15 - 1.40 mmol/L   TCO2 25 0 - 100 mmol/L   Hemoglobin 13.6 13.0 - 17.0 g/dL   HCT 08.640.0 57.839.0 - 46.952.0 %   No results found.  EKG  EKG Interpretation  Date/Time:  Thursday October 17 2016 22:15:10 EST Ventricular Rate:  50 PR Interval:    QRS Duration: 98 QT Interval:  431 QTC Calculation: 393 R Axis:   33 Text Interpretation:  Sinus rhythm ST elev, probable normal early repol pattern Confirmed by Denton LankSTEINL  MD, Caryn BeeKEVIN ([id number]) on 10/17/2016 10:28:42 PM       Radiology No results found.  Procedures Procedures (including critical care time)  Medications Ordered in ED Medications - No data to display   Initial Impression / Assessment and Plan / ED Course  I have reviewed the triage vital signs and the nursing notes.  Pertinent labs & imaging results that were available during my care of the patient were reviewed by me and considered in my medical decision making (see chart for details).  Clinical Course     Monitor. Ecg. Labs.  Po fluids.  Reviewed nursing notes and prior charts for additional history.   Has seen cardiology/[name], for same in past, no hx dysrhythmia or structural heart disease noted.   Patient ambulated in ED.  Other than mild sinus brady, no dysrhythmia on monitor.     Final Clinical Impressions(s) / ED Diagnoses   Final diagnoses:  None    New Prescriptions New Prescriptions   No medications on file     Cathren LaineKevin Joanthan Hlavacek, MD 10/17/16 2314

## 2016-10-17 NOTE — ED Notes (Signed)
Pt ambulated in hall with steady gait.

## 2016-10-17 NOTE — ED Triage Notes (Signed)
Pt brought in by [facility]. Pt was at church when he began to feel light headed and dizzy. Pt assisted to floor at church. Pt states he did not pass out and recalls all events. Pt has hx of multiple syncopal events.

## 2016-10-24 ENCOUNTER — Ambulatory Visit (INDEPENDENT_AMBULATORY_CARE_PROVIDER_SITE_OTHER): Payer: Self-pay | Admitting: Internal Medicine

## 2016-10-24 ENCOUNTER — Telehealth: Payer: Self-pay | Admitting: Internal Medicine

## 2016-10-24 ENCOUNTER — Encounter (INDEPENDENT_AMBULATORY_CARE_PROVIDER_SITE_OTHER): Payer: Self-pay

## 2016-10-24 ENCOUNTER — Encounter: Payer: Self-pay | Admitting: Internal Medicine

## 2016-10-24 VITALS — BP 120/74 | HR 64 | Ht 67.0 in | Wt 192.8 lb

## 2016-10-24 DIAGNOSIS — R55 Syncope and collapse: Secondary | ICD-10-CM

## 2016-10-24 MED ORDER — MIDODRINE HCL 5 MG PO TABS: ORAL_TABLET | ORAL | 6 refills | Status: DC

## 2016-10-24 NOTE — Patient Instructions (Addendum)
Medication Instructions:  Your physician has recommended you make the following change in your medication:  1) START Midodrine 5 mg twice daily, as needed  Labwork: None Ordered   Testing/Procedures: None Ordered   Follow-Up: Your physician wants you to follow-up in: 6 months with Dr. Ladona Ridgelaylor. You will receive a reminder letter in the mail two months in advance. If you don't receive a letter, please call our office to schedule the follow-up appointment.   Any Other Special Instructions Will Be Listed Below (If Applicable). Increase salt in your diet.    If you need a refill on your cardiac medications before your next appointment, please call your pharmacy.

## 2016-10-24 NOTE — Progress Notes (Signed)
      HPI Kreg Shropshirerevon returns today to followup syncope. 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 When I saw him last, he was improved. In the interim, he notes a couple of spells where he felt like he might pass out but did not. He would lie down and the episode would pass. He had an episode at church several weeks ago where he passed out and was taken to the ED. He had no arrhythmias on the monitor and was sent home. He noted that when he started to feel bad, he did not lie down like he normally would. No Known Allergies   Current Outpatient Prescriptions  Medication Sig Dispense Refill  . naproxen sodium (ANAPROX) 220 MG tablet Take 220 mg by mouth 2 (two) times daily with a meal.     No current facility-administered medications for this visit.      Past Medical History:  Diagnosis Date  . Right upper quadrant pain 10/18/2015  . Syncope 10/18/2015    ROS:   All systems reviewed and negative except as noted in the HPI.   Past Surgical History:  Procedure Laterality Date  . NO PAST SURGERIES       No family history on file.   Social History   Social History  . Marital status: Single    Spouse name: N/A  . Number of children: N/A  . Years of education: N/A   Occupational History  . Not on file.   Social History Main Topics  . Smoking status: Never Smoker  . Smokeless tobacco: Never Used  . Alcohol use No  . Drug use: No  . Sexual activity: Not on file   Other Topics Concern  . Not on file   Social History Narrative  . No narrative on file     BP 120/74   Pulse 64   Ht 5\' 7"  (1.702 m)   Wt 192 lb 12.8 oz (87.5 kg)   SpO2 98%   BMI 30.20 kg/m   Physical Exam:  Well appearing young man, NAD HEENT: Unremarkable Neck:  No JVD, no thyromegally Lymphatics:  No adenopathy Back:  No CVA tenderness Lungs:  Clear with no wheezes. HEART:  Regular rate rhythm, no murmurs, no rubs, no clicks Abd:  soft,  positive bowel sounds, no organomegally, no rebound, no guarding Ext:  2 plus pulses, no edema, no cyanosis, no clubbing Skin:  No rashes no nodules Neuro:  CN II through XII intact, motor grossly intact  EKG - NSR with LVH  Assess/Plan: 1. Syncope - he appears to be stable. I discussed the benign nature of his symptoms and recommended he continue his increased salt and fluid. I will give him a prescription for Midodrine to take as needed. 2. Chest pain - his symptoms are atypical. He will undergo watchful waiting. 3. Right upper quadrant pain - appears to be in remission. Will follow.  Leonia ReevesGregg Elsye Mccollister,M.D.

## 2016-10-24 NOTE — Telephone Encounter (Signed)
APT. REMINDER CALL, LMTCB °

## 2016-10-29 ENCOUNTER — Ambulatory Visit (INDEPENDENT_AMBULATORY_CARE_PROVIDER_SITE_OTHER): Payer: Self-pay | Admitting: Dietician

## 2016-10-29 ENCOUNTER — Encounter: Payer: Self-pay | Admitting: Dietician

## 2016-10-29 ENCOUNTER — Ambulatory Visit (INDEPENDENT_AMBULATORY_CARE_PROVIDER_SITE_OTHER): Payer: Self-pay | Admitting: Internal Medicine

## 2016-10-29 VITALS — BP 122/67 | HR 51 | Temp 97.6°F | Ht 67.0 in | Wt 195.1 lb

## 2016-10-29 DIAGNOSIS — Z713 Dietary counseling and surveillance: Secondary | ICD-10-CM

## 2016-10-29 DIAGNOSIS — Z5189 Encounter for other specified aftercare: Secondary | ICD-10-CM

## 2016-10-29 DIAGNOSIS — M7918 Myalgia, other site: Secondary | ICD-10-CM

## 2016-10-29 DIAGNOSIS — M25532 Pain in left wrist: Secondary | ICD-10-CM | POA: Insufficient documentation

## 2016-10-29 DIAGNOSIS — M791 Myalgia: Secondary | ICD-10-CM

## 2016-10-29 DIAGNOSIS — R55 Syncope and collapse: Secondary | ICD-10-CM

## 2016-10-29 NOTE — Patient Instructions (Signed)
It was a pleasure to see you today Mr. Billy Turner.  Given the chronicity of daily symptoms I would recommend trying to take midodrine twice daily for at least 1-2 weeks and see if there is improvement in your symptoms. You have a low resting heart rate likely from your physical conditioning but this might be contributing to your lightheadedness with standing up.  I will place a referral for a visit to our nutritionist if you would like to arrange a visit and discuss options for your vegetarian diet since you need to maintain a good amount of salt and fluid.  For your wrist I do not suspect a fracture based on my examination today. A strain should continue to heal but may hurt for weeks more. I do recommend taking NSAIDs such as ibuprofen or naproxen while this is going on. If this fails to improve in the next few weeks we may need to reassess.

## 2016-10-29 NOTE — Patient Instructions (Addendum)
You need about 2500-3000 calories each day spread out over the day. Recommend eating every 3-4 hours while awake.   A healthy weight for you is between 170-190#.   You need about 3 liters of fluid each day also. You can get this by drinking  8-10 8oz glasses of water  3-4 8oz glasses of fortified soymilk  1 8 ounces of juice each day  Suggestions:  1-  Eating whole grain bread or starch like brown rice to your beans.  2- Eating 1-2 peanut butter and jelly sandwiches at a time 3- Eat 1-2 hours before playing basket ball and drink 2-3 cups fo fluid 4- Have small snack which could be half a sandwich or soymilk or 1/2 ensure about every hour while playing basket ball and a meal or snack within 1 hour after playing  5- Drink 8-16  oz water every hour while playing basket ball   This is from web MD online:  When you exercise hard for 90 minutes or more, especially if you're doing something at high intensity that takes a lot of endurance, you need a diet that can help you perform at your peak and recover quickly afterward.  These five guidelines will help.  1. Load Up on Carbohydrates Carbs are an athlete's main fuel. Your body changes them to glucose, a form of sugar, and stores it in your muscles as glycogen.  When you exercise, your body changes glycogen into energy. If you exercise for under 90 minutes, you have enough glycogen in your muscles, even for high-intensity activities. But if your workout is longer than that, use these strategies:  "Carbohydrate loading for 3 or 4 days before an event can help top up your glycogen stores," says sports dietitian Paralee CancelJoy Dubost, PhD. Eat a diet that gets about 70% of its calories from carbohydrates, including breads, cereals, pasta, fruit, and vegetables, to achieve maximum carbohydrate storage. On the day of a big event, eat your last meal 3 to 4 hours before exercising, to give your stomach time to empty. Avoid eating sugary or starchy foods within  30 minutes of starting an activity; they can speed up dehydration. Replenish carbs, minerals, and water during long exercise sessions. Eat a snack and drink fluid every 15 to 20 minutes. Refined carbohydrates (with sugar or flour) pass quickly into the bloodstream, where they fuel working muscles. Many athletes prefer sports bars, sports drinks, or gels, since they're so convenient. But fruit and fruit juice are also excellent choices. Reload on carbohydrates after intensive exercise, too. "Since you don't need quick energy, it's best to choose less refined carbohydrates" such as a whole-grain bagel or carrot sticks, which provide both carbohydrates and a rich array of nutrients, Dubost says. 2. Get Enough Protein, But Not Too Much Protein doesn't provide a lot of fuel for energy. But you need it to maintain your muscles.  Know what you need. The average person needs 1.2 to 1.4 grams of protein per kilogram of body weight a day. That's about 88 grams of protein for a 150-pound person. A strength athlete may need up to 1.7 grams per kilogram of body weight. That's about 150 grams of protein for a 200-pound athlete. Favor foods. Getting too much protein can put a strain on your kidneys. Instead of protein supplements, eat high-quality protein, such as lean meats, fish, poultry, nuts, beans, eggs, or milk. Drink up. "Milk is one of the best foods for recovery after an event, because it provides a good balance  of protein and carbohydrates," Dubost says. Milk also has both casein and whey protein. The combination may be particularly helpful for athletes. Research shows that whey protein is absorbed quickly, which can help speed recovery immediately after an event. Casein is digested more slowly, helping to ensure long-term recovery of muscle after a grueling event. Milk also has calcium, which is important for maintaining strong bones.   3. Go Easy on Fat For long events, such as marathons, your body turns to  fat for energy when carbohydrate sources run low. Most athletes get all the fat they need by following the basic dietary guideline to eat mostly unsaturated fat from foods such as nuts, avocados, olives, vegetable oils, and fatty fish like salmon and tuna. Avoid fatty foods on the day of an event, since they can upset your stomach.  4. Drink Fluids Early and Often Intense exercise, especially in hot weather, can quickly leave you dehydrated. Dehydration, in turn, can hurt your performance and, in extreme cases, threaten your life. "All high-intensity athletes should drink fluids early and often," Dubost says. "And don't wait until you're thirsty. By the time you feel parched, you may be seriously dehydrated." "One way to monitor hydration is to keep an eye on the color of your urine," says Donzetta Matters, MD, a physician at [facility] [place] in [place] and an expert on dehydration. A pale yellow color means you're getting enough fluid. Bright yellow or dark urine means you're falling short. Because intense exercise makes you lose fluid quickly, it's a good idea to drink fluids before as well as during an event, Dubost says. Endurance athletes such as marathon runners or long-distance cyclists should drink 8 to 12 ounces of fluid every 10 or 15 minutes during an event. When possible, drink chilled fluids, which are more easily absorbed than room-temperature water. Chilled fluids also help cool your body down.  5. Replace Lost Electrolytes Sweating removes both fluids and electrolytes. Electrolytes help transmit nerve signals in your body. To replenish them, reach for sports drinks. If you're also losing a lot of fluid as you sweat, dilute sports drinks with equal amounts of water to get the best balance of fluid and electrolytes.

## 2016-10-29 NOTE — Assessment & Plan Note (Addendum)
A: He now had another syncopal episode on 10/17/16. Still this most consistently with a vasovagal syncope as there were no laboratory or EKG abnormalities and the classic prodrome. He has a significant resting bradycardia as low as 44 bpm in a supine position. I suspect this is due to his high level of physical activity. His physical examination, lack of family history, associated autonomic symptoms are reassuring against HOCM or other serious cardiac problem underlying. He does report daily occurrence of these lightheaded symptoms with postural change that preceded his loss of consciousness, so I think a trial of midodrine as recommended by Dr. Ladona Ridgelaylor would be appropriate at this time.  After some discussion with Lupita LeashDonna today I agree his symptoms might be related to his diet plus high level of physical activity as he has lost weight over the intervening time as well. Because he is having nearly daily symptoms we can expect to see some improvement with his diet change if this is an underlying part of the problem.  P: Recommended trying mitogen twice daily for at least 1-2 weeks with symptom monitoring Refer to speak with our nutritionist today about an appropriate vegetarian diet Return to clinic in about a month or as needed to see if symptoms are improving

## 2016-10-29 NOTE — Assessment & Plan Note (Signed)
His pain seems decreased today compared to previous visits. This does still seem to be mostly musculoskeletal with his breathing obstruction and almost daily playing basketball. He is currently taking nothing for the pain and it is not bothering him enough that he wants to take medicine for it. He still does not like to stretch this as much as recommended since pain is acutely worsened with stretching.

## 2016-10-29 NOTE — Progress Notes (Signed)
Medical Nutrition Therapy:  Appt start time: 0915 end time:  1015. Visit # 1   Nutritional Diagnosis:  NI-1.6 Predicted suboptional energy  As related to long periods of fasting/skipping meals and snacks or inadequate energy consumption with increased energy expenditure As evidenced by his weight loss, reports of  feeling of weakness and syncope after increased energy expenditure and decreased energy consumption and lack of knowledge about healthy meal planning for active lifestyle/sports performance.    Intervention:  Nutrition education about basics of nutrition and meal planning to optimize sports performance and growth.  Coordination of care: Discussed with Dr. Dimple Caseyice that patient has been going long periods of time with inadequate calorie consumption and increased activity/energy expedentiture alternating with one meal of high calorie/ volume intake  Assessment:  Primary concerns today: syncope, vegetarian wants to be vegan, trying to lose weight.   Billy Shropshirerevon is a 21 year old male who is here with his mother. They report that ~ 1-2 years ago he began cuting back on his food intake because of a pain he had in his right side. He also reports that at this time he decided to become a vegetarian, wanted to lose weight to 155# and has been told to follow a high salt diet due to syncope. Marland Kitchen. He reports there is food in the house that everyone else wants to eat, but when the fruit or juice in in the house that he likes, he eats/drinks it until is is gone in 1-2 days. He is thinking of becoming vegan. He has two brothers who are 6', lives at home with mother and  Does not cook, but does make his own peanut Preferred Learning Style:No preference indicated  Learning Readiness: Ready and Change in progress  ANTHROPOMETRICS: weight- 195#, height-68", BMI- 30, IBW-150-165#, Reasonable body weight- 170-190#- he appears very muscular WEIGHT HISTORY: Highest: reports that 1-2 years ago he was 165 and has dropped to  current weight of 195#, however, weight recorded in his chart in 2014 is 230# SLEEP:well from 3 AM to ~ 10 am Food Intolerances: describes himself as a "Picky" eater,  Nausea: none reported, Vomiting: after large amounts of food Diarrhea/ hair loss not reported Constipation: BM every 1-2 days Dining Out (times/week): ~ 2-3 times a week, chinese buffet MEDICATIONS: no herbs or supplements reported today, says he is supposed to take Naproxen, but he doesn't DIETARY INTAKE: Usual eating pattern includes 2 meals and 0-1 snacks per day. Everyday foods include fruit, beans, vegetables, orange juice, oatmeal.  Avoided foods include meats, many animal products for the past 1-2 years.   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 No milk or dairy since 89 years.   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   Estimated daily energy needs:  2800-3000  Calories to maintain or decrease fat weight gradually spread out throughout the day eating every 3-4 hours to maintain energy 70-100 g protein/day depending on activity and weight loss, should be 10-35% of total calories   Progress Towards Goal(s):  In progress. Teaching Method Utilized: Visual, Auditory, Hands on Handouts given during visit include: information about vegetarian dn vegan meal  planning emphasizing need for vitmain b12 if he wants to become vegan.  Barriers to learning/adherence to lifestyle change: lack of material resources-  Demonstrated degree of understanding via:  Teach Back   Monitoring/Evaluation:  Dietary intake, exercise, and body weight prn.

## 2016-10-29 NOTE — Assessment & Plan Note (Signed)
He has pain in the left wrist ongoing for about a month now. On physical exam he does have tenderness at the end of active or passive range of motion in extension or flexion. I do not appreciate any swelling and there is no tenderness over the joint. He has normal strength and there is no appreciable bony deformity. There is no significant history of trauma just normal exertion when symptoms started. This seems most consistent with an overuse injury or a recovering strain injury.  P: Recommend rest from intense physical exertion Stretching exercises for wrist Recommended NSAIDs for pain ibuprofen q6hrs vs naproxen q12hrs PRN OTC Consider RTC vs sports medicine if not improving further

## 2016-10-29 NOTE — Progress Notes (Signed)
   CC: ED follow up for syncope   HPI:  [name] is a 21 y.o. man with a history of recurrent vasovagal syncopal episodes and ongoing right sided pain who is here for follow-up of emergency department visit for syncope on 10/17/16. This most recent event was preceded by lightheadedness, flushing, "eyes rolling back and head" for at least a minute before losing consciousness and being caught by his father from falling. Since that time he was seen by Dr. Ladona Ridgelaylor of cardiology who recommended maintaining a high salt diet and could start midodrine twice daily as needed if these episodes are recurring. Laboratory workup and EKG were unremarkable besides a regular sinus bradycardia. He continues to be physically active playing basketball almost daily.  He additionally has a complaint of left wrist pain that he thinks is going on at least the past month ever since it "popped" while playing basketball. There was no fall or major associated trauma at the time. He feels some restriction to range of motion and is unable to pass using this wrist due to pain. There is no weakness or changes in sensation.  He also continues to have some degree of his chronic ongoing right sided abdominal/flank pain. It is actually much better at this time that his previous visits months ago. He currently takes nothing for this pain. He recently switched to a vegetarian diet and has not noticed any effect on his chronic pain with this.  See problem based assessment and plan below for additional details  Past Medical History:  Diagnosis Date  . Right upper quadrant pain 10/18/2015  . Syncope 10/18/2015    Review of Systems:   Review of Systems  Constitutional: Positive for diaphoresis. Negative for fever.  Eyes: Negative for blurred vision.  Respiratory: Negative for shortness of breath.   Cardiovascular: Negative for chest pain.  Gastrointestinal: Positive for abdominal pain.  Musculoskeletal: Positive for joint  pain. Negative for back pain and neck pain.  Neurological: Positive for dizziness and loss of consciousness.  Psychiatric/Behavioral: Negative for depression and memory loss.     Physical Exam:  Vitals:   10/29/16 0834  BP: 122/67  Pulse: (!) 51  Temp: 97.6 F (36.4 C)  TempSrc: Oral  SpO2: 100%  Weight: 195 lb 1.6 oz (88.5 kg)  Height: 5\' 7"  (1.702 m)   GENERAL- alert, co-operative, NAD CARDIAC- RRR, no murmurs, rubs or gallops. RESP- CTAB, no wheezes or crackles. CHEST WALL- Tenderness to palpation over right lower costal border, no skin change, no deformity ABDOMEN- Soft, nontender BACK- Paraspinal tenderness on right over middle thoracolumbar spine, no deformity EXTREMITIES- pain on end of ROM with flexion and extension of left wrist, strength 5/5 SKIN- Warm, dry, No rash or lesion. PSYCH- Normal mood and affect, appropriate thought content and speech   Assessment & Plan:   See Encounters Tab for problem based charting.  Patient discussed with Dr. Rogelia BogaButcher

## 2016-11-01 NOTE — Progress Notes (Signed)
Internal Medicine Clinic Attending  Case discussed with Dr. Rice soon after the resident saw the patient.  We reviewed the resident's history and exam and pertinent patient test results.  I agree with the assessment, diagnosis, and plan of care documented in the resident's note. 

## 2016-11-06 ENCOUNTER — Encounter: Payer: Self-pay | Admitting: Internal Medicine

## 2017-01-15 IMAGING — CR DG CHEST 1V
1 series · 1 of 1 positions shown · non-contrast
Comparison: Rib series from the same day, and previous

CLINICAL DATA: Syncope this AM while having blood drawn. Pt had not
eaten anything since APPROX.[DATE] last night. PA chest imaging
available on right side rib exam. Pt also c/o right side rib pain
off and on over the past 3 years. Nonsmoker.

EXAM:
CHEST  1 VIEW

[chest lat]
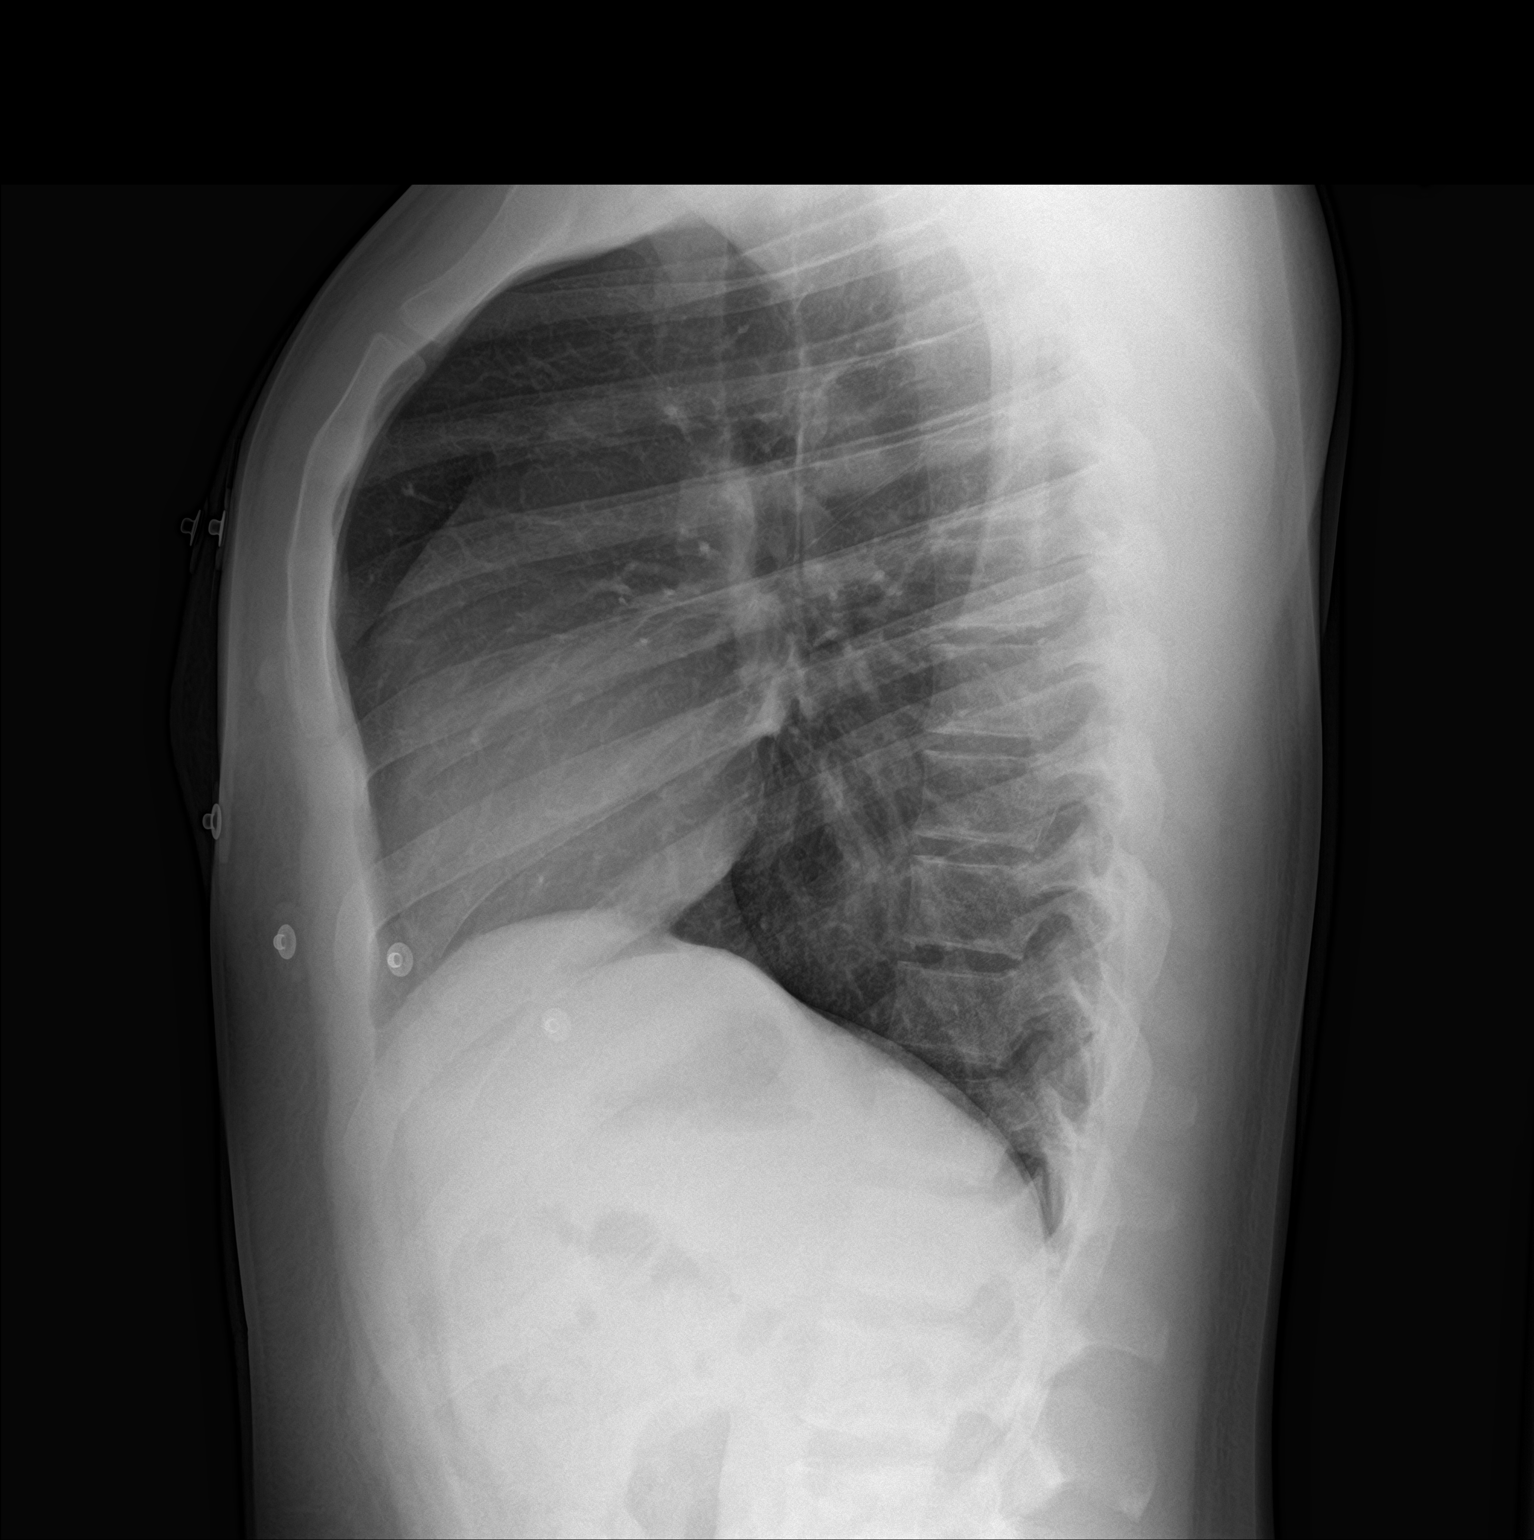

[1 of 1 positions shown; findings below may reference images not displayed]

FINDINGS: Lungs are clear. Heart size and mediastinal contours are within
normal limits.
No effusion.
Visualized skeletal structures are unremarkable.
IMPRESSION: No acute cardiopulmonary disease.

## 2017-01-15 IMAGING — CR DG RIBS W/ CHEST 3+V*R*
1 series · 1 of 1 positions shown · non-contrast
Comparison: None.

CLINICAL DATA: Syncope this AM while having blood drawn. Pt had not
eaten anything since APPROX.[DATE] last night. Pt also c/o right
side rib pain off and on over the past 3 years. No specific injury
that pt can recall

EXAM:
RIGHT RIBS AND CHEST - 3+ VIEW

[chest pa]
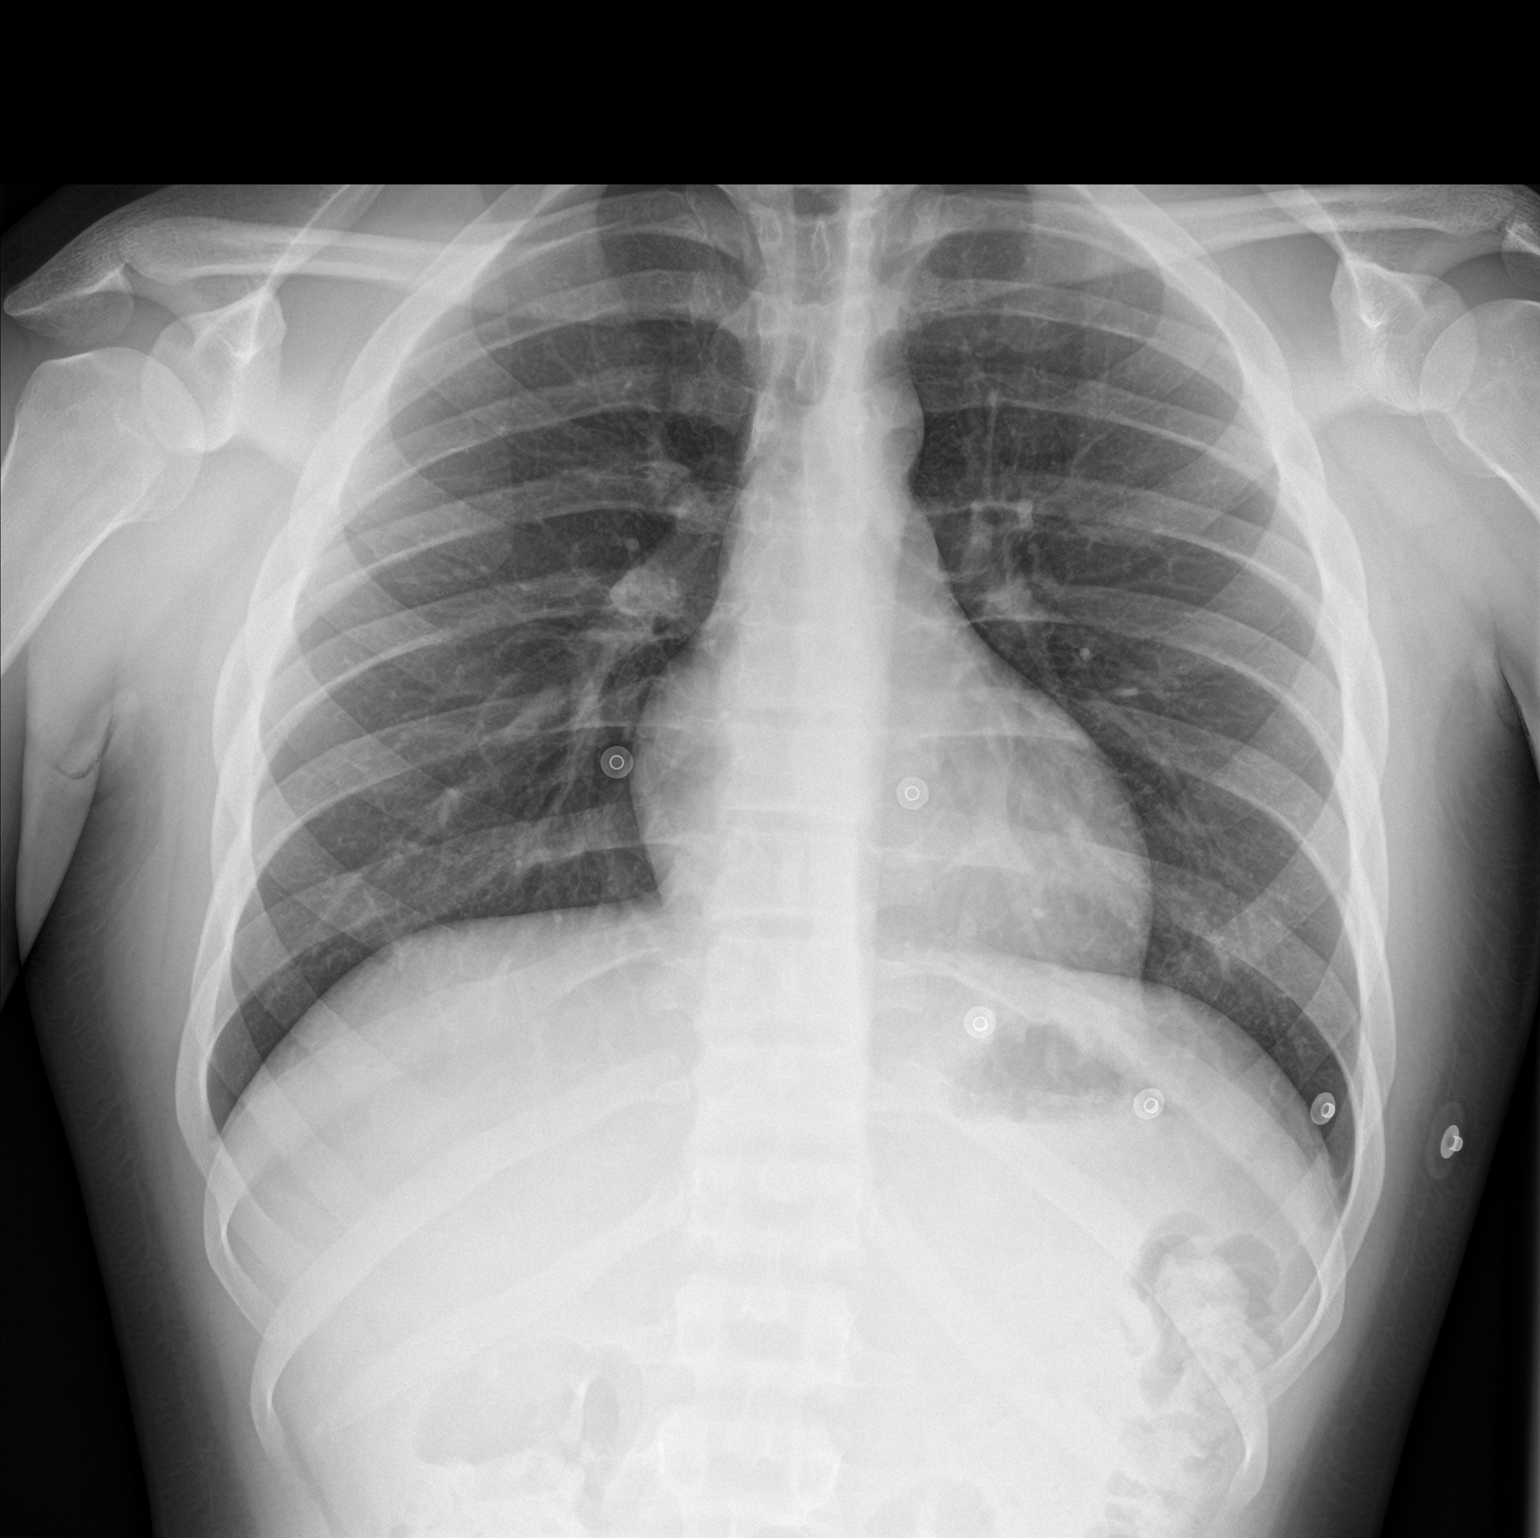

[1 of 1 positions shown; findings below may reference images not displayed]

FINDINGS: No fracture or other bone lesions are seen involving the ribs. There
is no evidence of pneumothorax or pleural effusion. Both lungs are
clear. Heart size and mediastinal contours are within normal limits.
IMPRESSION: Negative.

## 2017-03-08 IMAGING — US US ABDOMEN COMPLETE
1 series · 13 of 25 positions shown · non-contrast
Comparison: None impacts

CLINICAL DATA: Two years of intermittent right upper quadrant pain.

EXAM:
ABDOMEN ULTRASOUND COMPLETE

[Series 1: us abdomen complete · 0.30mm/px · 13 of 81 slices shown]
[im 1/81]
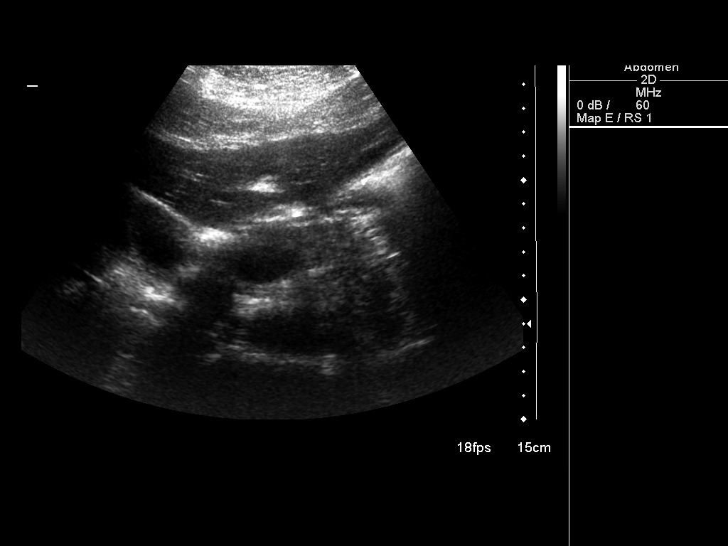
[im 7/81]
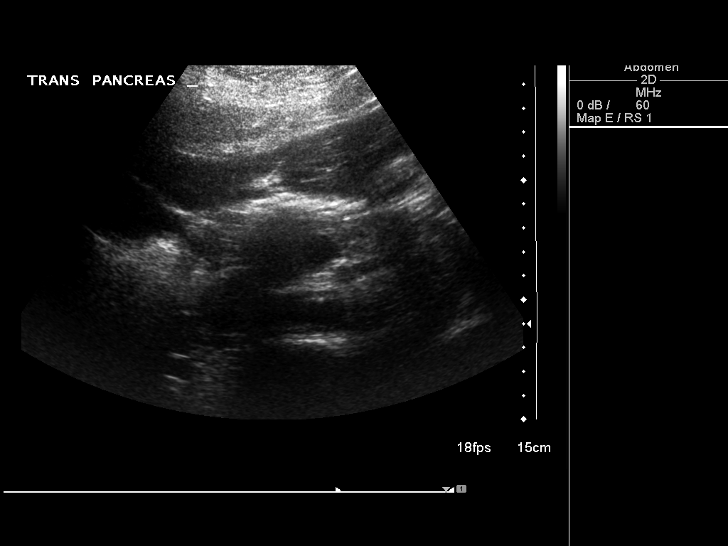
[im 14/81]
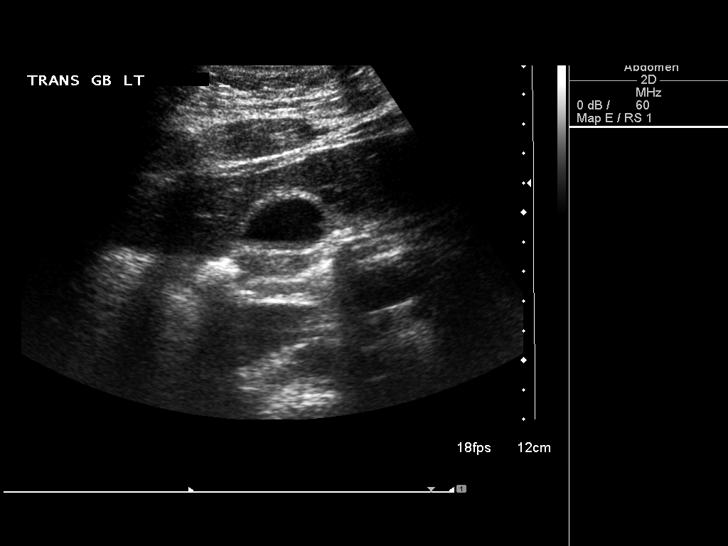
[im 21/81]
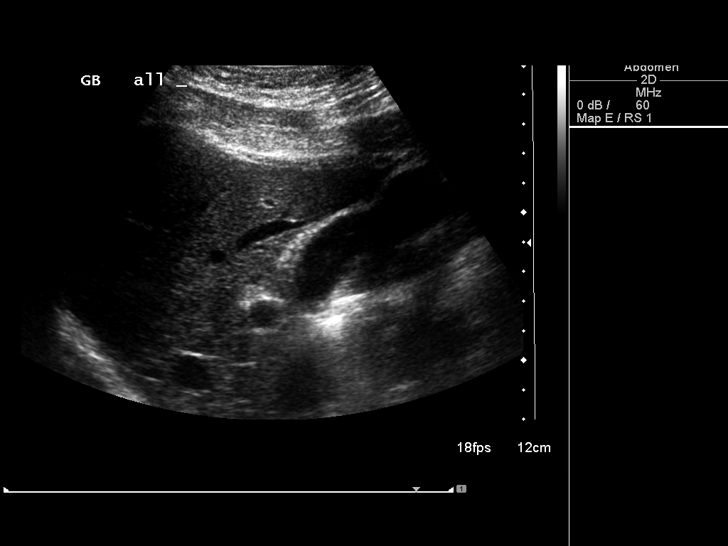
[im 27/81]
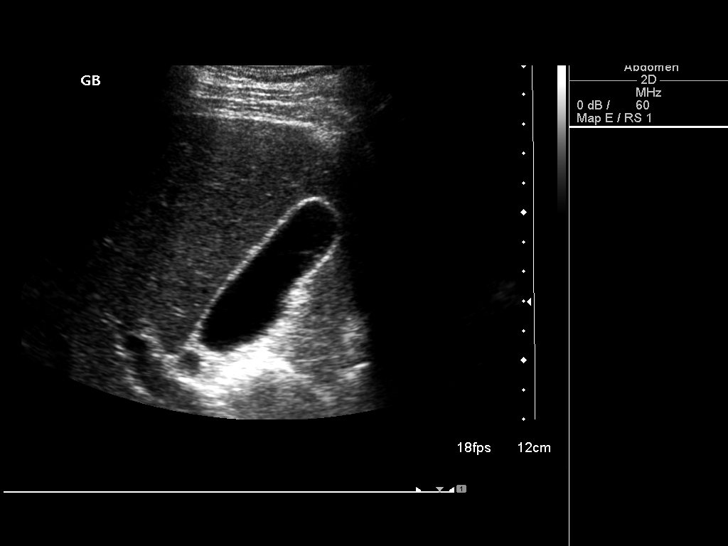
[im 34/81]
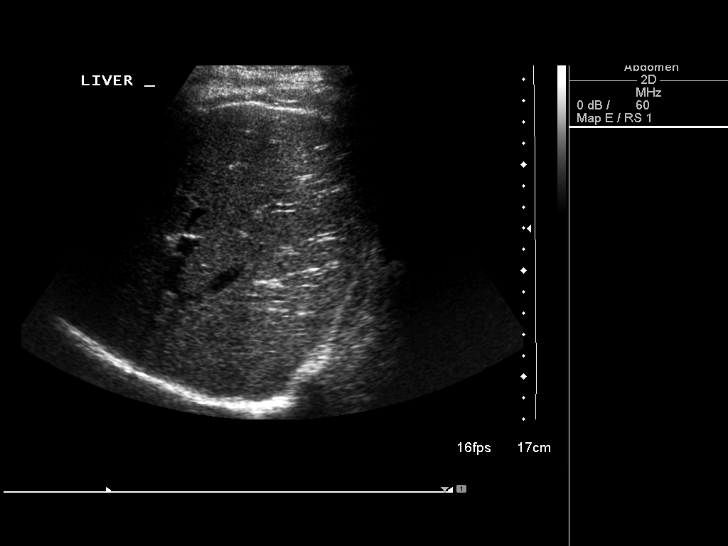
[im 41/81]
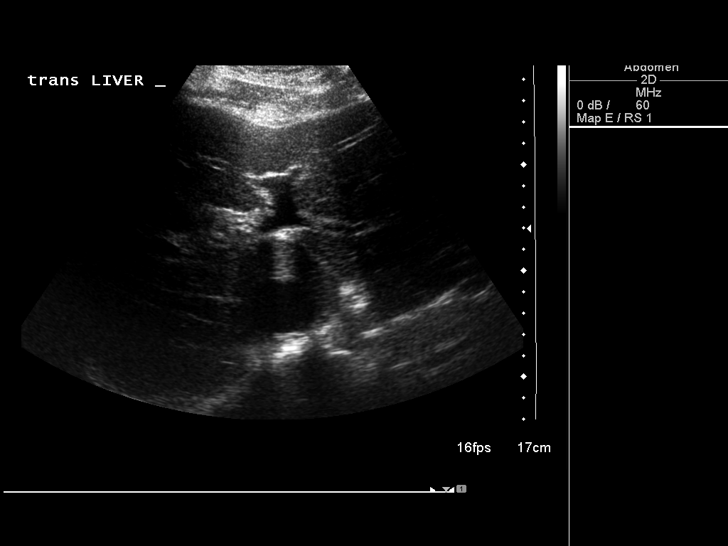
[im 47/81]
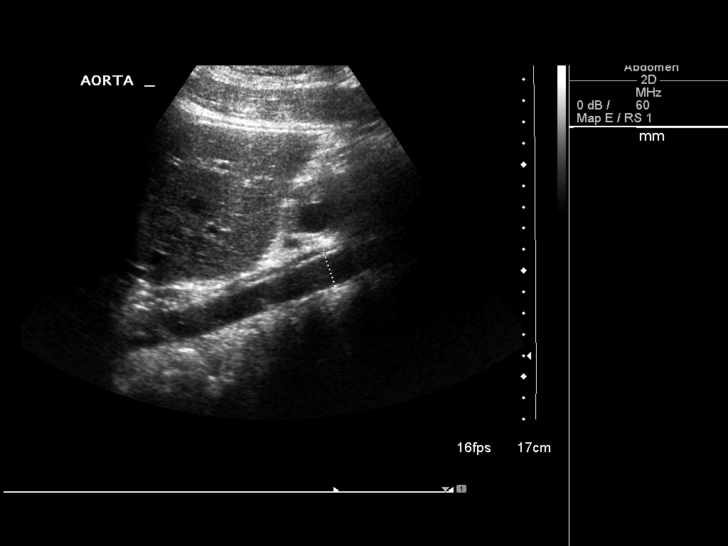
[im 54/81]
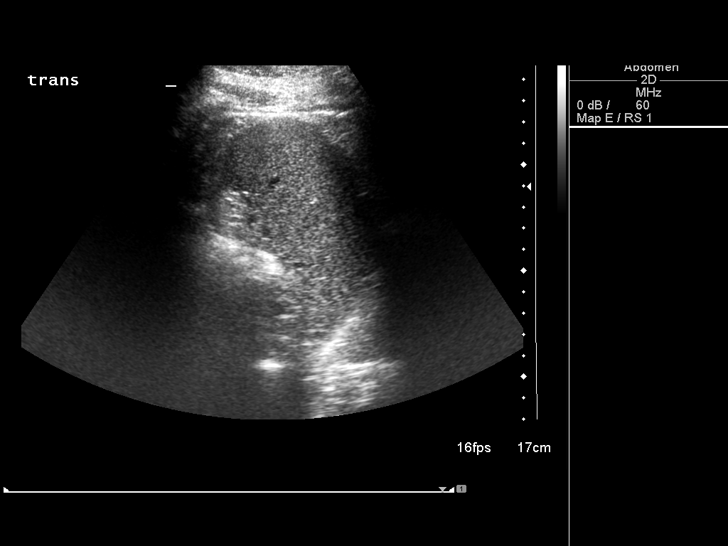
[im 61/81]
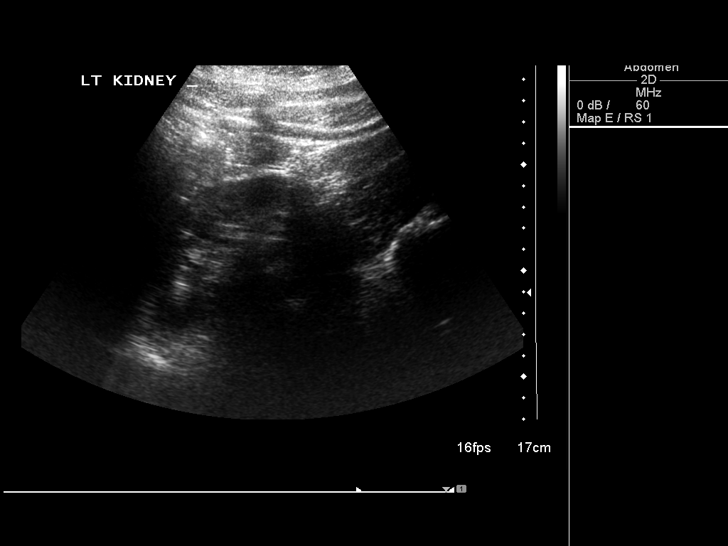
[im 67/81]
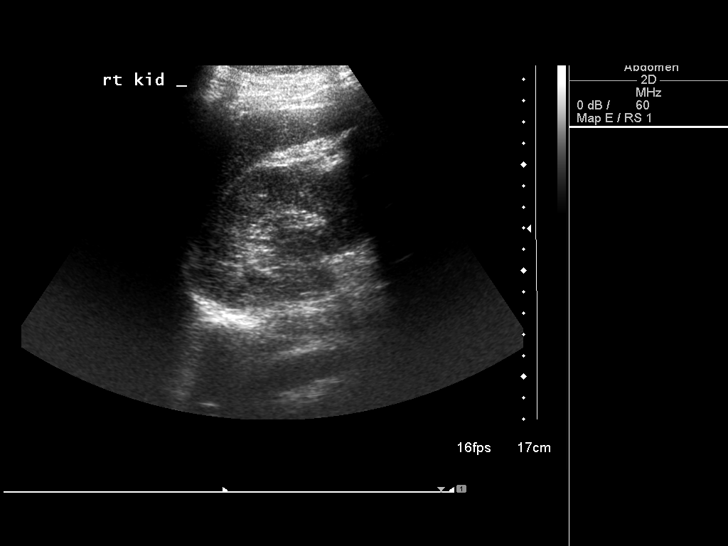
[im 74/81]
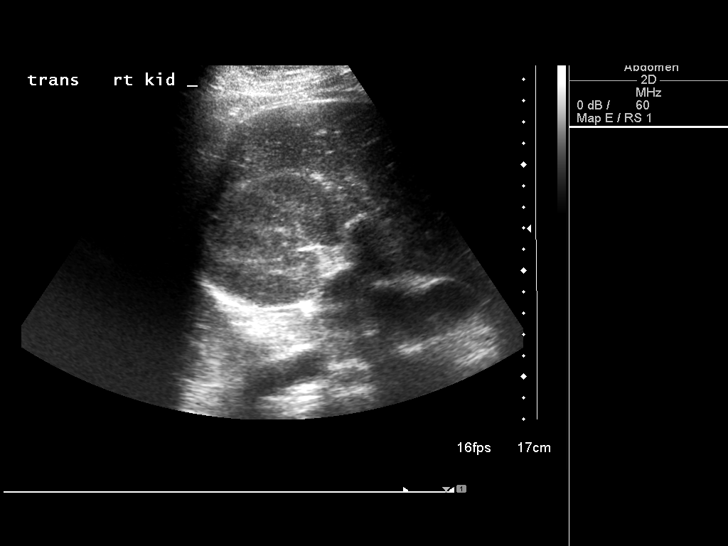
[im 81/81]
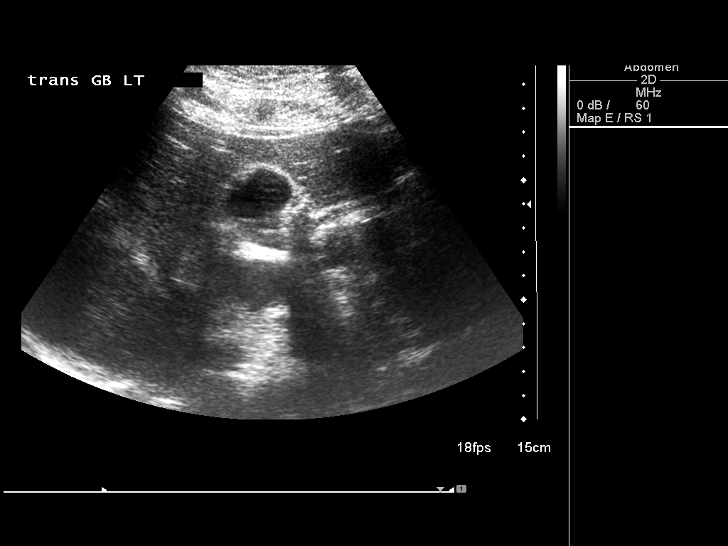

[13 of 25 positions shown; findings below may reference images not displayed]

FINDINGS: Bowel gas limits the study.

Gallbladder: The gallbladder is adequately distended. No stones or
sludge are observed. There is no wall thickening or pericholecystic
fluid. There was some tenderness over the gallbladder to palpation.

Common bile duct: Diameter: 2.9 mm

Liver: The liver exhibits normal echotexture with no focal mass nor
ductal dilation.

IVC: No abnormality visualized.

Pancreas: The pancreatic body is normal in appearance. There is
limited visualization of the pancreatic head and tail.

Spleen: Size and appearance within normal limits.

Right Kidney: Length: 10.9 cm.. The renal echotexture is
approximately equal to that of the adjacent liver. There is no
hydronephrosis.

Left Kidney: Length: 10.2 cm. The renal cortical echotexture is
similar to that on the right. There is no hydronephrosis.

Abdominal aorta: No aneurysm is demonstrated though visualization of
the aorta is limited.

Other findings: There is no ascites.
IMPRESSION: 1. No gallstones or sonographic evidence of acute cholecystitis.
There is mild tenderness over the gallbladder. If gallbladder
dysfunction is suspected clinically, a nuclear medicine
hepatobiliary scan may be useful.
2. No acute abnormality observed elsewhere within the abdomen.
Mildly increased renal cortical echotexture may reflect medical
renal disease but could be normal given the patient's age.
Correlation with the patient's renal function is needed.

## 2018-01-22 ENCOUNTER — Encounter: Payer: Self-pay | Admitting: Internal Medicine

## 2018-01-29 ENCOUNTER — Ambulatory Visit (INDEPENDENT_AMBULATORY_CARE_PROVIDER_SITE_OTHER): Payer: 59 | Admitting: Internal Medicine

## 2018-01-29 ENCOUNTER — Encounter: Payer: Self-pay | Admitting: Internal Medicine

## 2018-01-29 ENCOUNTER — Other Ambulatory Visit: Payer: Self-pay

## 2018-01-29 VITALS — BP 135/64 | HR 52 | Temp 97.8°F | Ht 69.0 in | Wt 203.4 lb

## 2018-01-29 DIAGNOSIS — E663 Overweight: Secondary | ICD-10-CM | POA: Insufficient documentation

## 2018-01-29 DIAGNOSIS — Z0000 Encounter for general adult medical examination without abnormal findings: Secondary | ICD-10-CM

## 2018-01-29 DIAGNOSIS — R0781 Pleurodynia: Secondary | ICD-10-CM

## 2018-01-29 DIAGNOSIS — M7918 Myalgia, other site: Secondary | ICD-10-CM

## 2018-01-29 DIAGNOSIS — Z6830 Body mass index (BMI) 30.0-30.9, adult: Secondary | ICD-10-CM

## 2018-01-29 DIAGNOSIS — Z23 Encounter for immunization: Secondary | ICD-10-CM | POA: Diagnosis not present

## 2018-01-29 NOTE — Assessment & Plan Note (Signed)
His Body mass index is 30.04 kg/m.  He is active playing basketball but was encouraged to continue exercising and eating well in an effort to get to a more healthy BMI.

## 2018-01-29 NOTE — Assessment & Plan Note (Signed)
He appears to be doing well with this right sided rib pain that has been documented on previous visits.  He has still been able to carry on with his normal day to day activities including working and playing basketball.  He is currently not taking anything for his pain.

## 2018-01-29 NOTE — Assessment & Plan Note (Signed)
He is due for screening HIV and Tdap and is agreeable to obtaining each.  Plan: - HIV Ab - Tdap

## 2018-01-29 NOTE — Progress Notes (Signed)
   CC: rib pain, annual follow up, and needs screening for health maintenance  HPI:  [name] is a 22 y.o. man with a past medical history listed below here today for his annual visit.  He is currently working at a nursing home, playing basketball regularly, living at home with mom, dad, and 2 brothers.  He completed 12th grade.  He denies alcohol, tobacco, drug use.  He reports not being sexually active.   For details of today's visit and the status of his chronic medical issues please refer to the assessment and plan.   Past Medical History:  Diagnosis Date  . Right upper quadrant pain 10/18/2015  . Syncope 10/18/2015   Review of Systems:  Review of Systems  Constitutional: Negative for chills and fever.  Musculoskeletal: Positive for joint pain.       Right rib pain.  Skin: Positive for rash.       Had a rash on his hands that he thought was related to detergent at work.  Has since cleared up.   Psychiatric/Behavioral: Negative for depression. The patient is not nervous/anxious.      Physical Exam:  Vitals:   01/29/18 1558  BP: 135/64  Pulse: (!) 52  Temp: 97.8 F (36.6 C)  TempSrc: Oral  SpO2: 99%  Weight: 203 lb 6.4 oz (92.3 kg)  Height: 5\' 9"  (1.753 m)   Physical Exam  Constitutional: He is oriented to person, place, and time and well-developed, well-nourished, and in no distress.  HENT:  Head: Normocephalic and atraumatic.  Eyes: Conjunctivae and EOM are normal.  Cardiovascular: Normal rate and regular rhythm.  Pulmonary/Chest: Effort normal and breath sounds normal.  Musculoskeletal: He exhibits tenderness.  Tender over his right rib cage area.   Neurological: He is alert and oriented to person, place, and time.  Skin: Skin is warm and dry.  Psychiatric: Mood and affect normal.    Body mass index is 30.04 kg/m.  Assessment & Plan:   See Encounters Tab for problem based charting.  Patient discussed with Dr. Rogelia BogaButcher.  Musculoskeletal pain He  appears to be doing well with this right sided rib pain that has been documented on previous visits.  He has still been able to carry on with his normal day to day activities including working and playing basketball.  He is currently not taking anything for his pain.  Healthcare maintenance He is due for screening HIV and Tdap and is agreeable to obtaining each.  Plan: - HIV Ab - Tdap   Overweight His Body mass index is 30.04 kg/m.  He is active playing basketball but was encouraged to continue exercising and eating well in an effort to get to a more healthy BMI.

## 2018-01-29 NOTE — Patient Instructions (Signed)
FOLLOW-UP INSTRUCTIONS When: 1 year For: annual follow up   I will call you with any abnormal lab results.  Otherwise, we will see you in about 1 year for follow up.

## 2018-01-30 LAB — HIV ANTIBODY (ROUTINE TESTING W REFLEX): HIV SCREEN 4TH GENERATION: NONREACTIVE

## 2018-01-30 NOTE — Progress Notes (Signed)
Internal Medicine Clinic Attending  Case discussed with Dr. Wallace at the time of the visit.  We reviewed the resident's history and exam and pertinent patient test results.  I agree with the assessment, diagnosis, and plan of care documented in the resident's note.  

## 2018-04-25 ENCOUNTER — Encounter: Payer: Self-pay | Admitting: *Deleted

## 2019-04-14 ENCOUNTER — Encounter: Payer: Self-pay | Admitting: Internal Medicine

## 2019-04-14 ENCOUNTER — Ambulatory Visit (INDEPENDENT_AMBULATORY_CARE_PROVIDER_SITE_OTHER): Payer: 59 | Admitting: Internal Medicine

## 2019-04-14 ENCOUNTER — Other Ambulatory Visit: Payer: Self-pay

## 2019-04-14 VITALS — BP 135/68 | HR 65 | Temp 98.2°F | Ht 69.0 in | Wt 230.3 lb

## 2019-04-14 DIAGNOSIS — Z0000 Encounter for general adult medical examination without abnormal findings: Secondary | ICD-10-CM | POA: Diagnosis not present

## 2019-04-14 DIAGNOSIS — F32A Depression, unspecified: Secondary | ICD-10-CM | POA: Insufficient documentation

## 2019-04-14 DIAGNOSIS — R6889 Other general symptoms and signs: Secondary | ICD-10-CM | POA: Insufficient documentation

## 2019-04-14 DIAGNOSIS — R55 Syncope and collapse: Secondary | ICD-10-CM | POA: Diagnosis not present

## 2019-04-14 DIAGNOSIS — F329 Major depressive disorder, single episode, unspecified: Secondary | ICD-10-CM | POA: Diagnosis not present

## 2019-04-14 DIAGNOSIS — M7918 Myalgia, other site: Secondary | ICD-10-CM

## 2019-04-14 NOTE — Progress Notes (Signed)
   CC: Follow up for syncope, musculoskeletal pain, cold intolerance, depression  HPI:  [name] is a 23 y.o. M with PMHx listed below presenting for Follow up for syncope, musculoskeletal pain, cold intolerance, depression. Please see the A&P for the status of the patient's chronic medical problems.  Past Medical History:  Diagnosis Date  . Right upper quadrant pain 10/18/2015  . Syncope 10/18/2015   Review of Systems:  Performed and all others negative.   Physical Exam:  Vitals:   04/14/19 1553  BP: 135/68  Pulse: 65  Temp: 98.2 F (36.8 C)  SpO2: 100%  Weight: 230 lb 4.8 oz (104.5 kg)   Physical Exam Constitutional:      General: He is not in acute distress.    Appearance: Normal appearance.  Neck:     Musculoskeletal: Neck supple. No muscular tenderness.  Cardiovascular:     Rate and Rhythm: Normal rate and regular rhythm.     Pulses: Normal pulses.     Heart sounds: Normal heart sounds.  Pulmonary:     Effort: Pulmonary effort is normal. No respiratory distress.     Breath sounds: Normal breath sounds.  Abdominal:     General: Bowel sounds are normal. There is no distension.     Palpations: Abdomen is soft.     Tenderness: There is no abdominal tenderness.  Musculoskeletal:        General: No swelling or deformity.     Comments: Tender to palpation at right lower chest  Skin:    General: Skin is warm and dry.  Neurological:     General: No focal deficit present.     Mental Status: Mental status is at baseline.    Assessment & Plan:   See Encounters Tab for problem based charting.  Patient discussed with Dr. Angelia Mould

## 2019-04-14 NOTE — Assessment & Plan Note (Signed)
Patient has not had standard labs in last couple of years will obtain BMP, CBC. Will also add B12 given he has been on vegan diet for 2 years and does not take and supplements.

## 2019-04-14 NOTE — Assessment & Plan Note (Signed)
Patient reports on going cold intolerance. He states he used to typically feel warm most of the time, but for the past months he has felt cooler than other people. He also endorses some increased weight gain. Will evaluate for anemia with CBC (standard labs planned for today), and check thyroid function as well. - CBC - TSH

## 2019-04-14 NOTE — Assessment & Plan Note (Signed)
Patient has a history of vasovagal syncope with fairly frequent episodes. One known trigger is vomiting. He states that he has had about 6 episodes since he was last seen about 2 years ago. He knows when the are coming on as he has a typical prodrome of warmth and other sensations. He states he is typically able to get him self into a safe position before he passes out. - Continue to monitor

## 2019-04-14 NOTE — Assessment & Plan Note (Signed)
Patient continues to have right sided musculoskeletal pain that come and goes. He remains tender to palpation in the are, but it does not hurt when not palpated at this time. He is able to manage his symptoms when they flair - Continue PRN NSAIDs

## 2019-04-14 NOTE — Patient Instructions (Signed)
Thank you for allowing [place] to care for you  For your cold intolerance - We will check labs today included blood count and Thyroid function - You will be contacted with the results  Follow up with counseling as planned, let us know if you need assistance from our office  Continue as need pain medicine for chest wall pain

## 2019-04-14 NOTE — Assessment & Plan Note (Signed)
Patient report feelings of depression. He endorses intermittent SI and has though of plans at times in the past. He states he has never made any preparation nor does he think he could every follow through. He states he is not currently having any suicidal thoughts. He states his family has arranged counseling for him, which is scheduled for next thrusday. He was informed that we have resources available to assist him with medications if needed and that we have counselor in our clinic if he would need that resource in the future. - Continue monitor - Follow up with counseling as planned

## 2019-04-15 LAB — BMP8+ANION GAP
Anion Gap: 17 mmol/L (ref 10.0–18.0)
BUN/Creatinine Ratio: 9 (ref 9–20)
BUN: 8 mg/dL (ref 6–20)
CO2: 21 mmol/L (ref 20–29)
Calcium: 9.8 mg/dL (ref 8.7–10.2)
Chloride: 102 mmol/L (ref 96–106)
Creatinine, Ser: 0.86 mg/dL (ref 0.76–1.27)
GFR calc Af Amer: 143 mL/min/{1.73_m2} (ref >59)
GFR calc non Af Amer: 124 mL/min/{1.73_m2} (ref >59)
Glucose: 89 mg/dL (ref 65–99)
Potassium: 4.1 mmol/L (ref 3.5–5.2)
Sodium: 140 mmol/L (ref 134–144)

## 2019-04-15 LAB — CBC
Hematocrit: 39.6 % (ref 37.5–51.0)
Hemoglobin: 14 g/dL (ref 13.0–17.7)
MCH: 30.8 pg (ref 26.6–33.0)
MCHC: 35.4 g/dL (ref 31.5–35.7)
MCV: 87 fL (ref 79–97)
Platelets: 196 10*3/uL (ref 150–450)
RBC: 4.54 x10E6/uL (ref 4.14–5.80)
RDW: 12.7 % (ref 11.6–15.4)
WBC: 4.3 10*3/uL (ref 3.4–10.8)

## 2019-04-15 LAB — VITAMIN B12: Vitamin B-12: 192 pg/mL — ABNORMAL LOW (ref 232–1245)

## 2019-04-15 LAB — TSH: TSH: 0.996 u[IU]/mL (ref 0.450–4.500)

## 2019-04-16 ENCOUNTER — Telehealth: Payer: Self-pay | Admitting: Internal Medicine

## 2019-04-16 DIAGNOSIS — E538 Deficiency of other specified B group vitamins: Secondary | ICD-10-CM | POA: Insufficient documentation

## 2019-04-16 NOTE — Assessment & Plan Note (Signed)
Patient has been on vegan diet for about 2 years. B12 check and is low, will prescribe supplementation and recheck in about 3 months. - B12 106mcg daily

## 2019-04-16 NOTE — Telephone Encounter (Addendum)
Contacted patient regarding recent lab work. BMP, CBC, TSH all WNL.   B12 was low, likely related to his vegan diet. Have prescribed patient B12 101mcg Daily. He is to follow up in 3 months for recheck. - B12 1024mcg Daily ordered - Follow up in 3 months  Patient did not answer but someone else did (unclear who). Patient to be given message to call back.

## 2019-04-19 MED ORDER — VITAMIN B-12 1000 MCG PO TABS: 1000.0000 ug | ORAL_TABLET | Freq: Every day | ORAL | 3 refills | Status: DC

## 2019-04-19 NOTE — Telephone Encounter (Signed)
Patient returned call. Given info below. He will begin Vit B12 1000 mcg daily and f/u in 3 months. Hubbard Hartshorn, RN, BSN

## 2019-04-19 NOTE — Progress Notes (Signed)
Internal Medicine Clinic Attending  Case discussed with Dr. Melvin  at the time of the visit.  We reviewed the resident's history and exam and pertinent patient test results.  I agree with the assessment, diagnosis, and plan of care documented in the resident's note.  

## 2021-02-01 ENCOUNTER — Other Ambulatory Visit: Payer: Self-pay

## 2021-02-01 ENCOUNTER — Ambulatory Visit: Payer: No Typology Code available for payment source | Admitting: Student

## 2021-02-01 VITALS — BP 117/68 | HR 80 | Temp 98.1°F | Ht 69.0 in | Wt 189.7 lb

## 2021-02-01 DIAGNOSIS — R6889 Other general symptoms and signs: Secondary | ICD-10-CM

## 2021-02-01 DIAGNOSIS — E538 Deficiency of other specified B group vitamins: Secondary | ICD-10-CM | POA: Diagnosis not present

## 2021-02-01 DIAGNOSIS — F32A Depression, unspecified: Secondary | ICD-10-CM

## 2021-02-01 DIAGNOSIS — R1011 Right upper quadrant pain: Secondary | ICD-10-CM

## 2021-02-01 DIAGNOSIS — R634 Abnormal weight loss: Secondary | ICD-10-CM | POA: Diagnosis not present

## 2021-02-01 DIAGNOSIS — Z0000 Encounter for general adult medical examination without abnormal findings: Secondary | ICD-10-CM

## 2021-02-01 DIAGNOSIS — R1111 Vomiting without nausea: Secondary | ICD-10-CM

## 2021-02-01 NOTE — Patient Instructions (Signed)
Thank you, [name] for allowing [place] to provide your care today. Today we discussed weight loss, Right side pain, depression and syncope. I am referring you again to GI for further evaluation of your R side pain. I have also order a test to look at your esophagus.   I have ordered the following labs for you:   Lab Orders     Hepatitis C antibody     CMP14 + Anion Gap     CBC with Diff     Vitamin B12   I will call if any are abnormal. All of your labs can be accessed through "My Chart".  I have place a referrals to Gastroenterology  I have ordered the following tests: Barium Swallow to evaluate your esophagus  1. I have ordered the following medication/changed the following me  Please follow-up in 3 months  Should you have any questions or concerns please call the internal medicine clinic at [phone].    Sharrell Ku, MD, MPH [facility]   My Chart Access: [email or web address]   If you have not already done so, please get your COVID 19 vaccine  To schedule an appointment for a COVID vaccine choice any of the following: Go to [email or web address]   Go to [email or web address]                  Call [phone]                                     Call [phone] and select Option 2

## 2021-02-01 NOTE — Progress Notes (Signed)
   CC: Follow up  HPI:  Billy Turner is a 25 y.o. M with PMH as below presents to clinic for follow up on his chronic medical problems. Please see problem based charting for evaluation, assessment and plan.  Past Medical History:  Diagnosis Date  . Right upper quadrant pain 10/18/2015  . Syncope 10/18/2015    Review of Systems:  Constitutional: Negative for fever. Positive for fatigue, loss of appetite, cold intolerance and weight loss. Negative for night sweats.  Positive for occasional dizziness Eyes: Negative for visual changes Respiratory: Negative for shortness of breath or wheezing Cardiac: Negative for chest pain MSK: Positive for right side pain Abdomen: Positive for vomiting RUQ pain. Negative for constipation, nausea or diarrhea.  Neuro: Negative for headache or weakness  Physical Exam: General: Pleasant, well-appearing young man. NAD Cardiac: RRR. No murmurs, rubs or gallops.  No LE edema Respiratory: Lungs CTAB. No wheezing or crackles. Abdominal: Soft, symmetric. Moderate tenderness to the palpation of the RUQ and right flank.  No rebound tenderness.  Normal BS. Skin: Warm, dry and intact without rashes or lesions Extremities: Atraumatic. Full ROM. Pulse palpable. Neuro: A&O x 3. Moves all extremities.  No focal deficits. Psych: Appropriate mood and affect.  Vitals:   02/01/21 1425  BP: 117/68  Pulse: 80  Temp: 98.1 F (36.7 C)  TempSrc: Oral  SpO2: 100%  Weight: 189 lb 11.2 oz (86 kg)  Height: 5\' 9"  (1.753 m)    Assessment & Plan:   See Encounters Tab for problem based charting.  Patient discussed with Dr. , MD, MPH

## 2021-02-02 ENCOUNTER — Encounter: Payer: Self-pay | Admitting: Student

## 2021-02-02 DIAGNOSIS — R1011 Right upper quadrant pain: Secondary | ICD-10-CM | POA: Insufficient documentation

## 2021-02-02 DIAGNOSIS — R634 Abnormal weight loss: Secondary | ICD-10-CM | POA: Insufficient documentation

## 2021-02-02 LAB — CBC WITH DIFFERENTIAL/PLATELET
Basophils Absolute: 0 10*3/uL (ref 0.0–0.2)
Basos: 1 %
EOS (ABSOLUTE): 0 10*3/uL (ref 0.0–0.4)
Eos: 0 %
Hematocrit: 43.4 % (ref 37.5–51.0)
Hemoglobin: 14.8 g/dL (ref 13.0–17.7)
Immature Grans (Abs): 0 10*3/uL (ref 0.0–0.1)
Immature Granulocytes: 0 %
Lymphocytes Absolute: 1.9 10*3/uL (ref 0.7–3.1)
Lymphs: 37 %
MCH: 30.6 pg (ref 26.6–33.0)
MCHC: 34.1 g/dL (ref 31.5–35.7)
MCV: 90 fL (ref 79–97)
Monocytes Absolute: 0.4 10*3/uL (ref 0.1–0.9)
Monocytes: 8 %
Neutrophils Absolute: 2.7 10*3/uL (ref 1.4–7.0)
Neutrophils: 54 %
Platelets: 185 10*3/uL (ref 150–450)
RBC: 4.83 x10E6/uL (ref 4.14–5.80)
RDW: 13.1 % (ref 11.6–15.4)
WBC: 5.1 10*3/uL (ref 3.4–10.8)

## 2021-02-02 LAB — CMP14 + ANION GAP
ALT: 11 IU/L (ref 0–44)
AST: 32 IU/L (ref 0–40)
Albumin/Globulin Ratio: 1.5 (ref 1.2–2.2)
Albumin: 4.3 g/dL (ref 4.1–5.2)
Alkaline Phosphatase: 62 IU/L (ref 44–121)
Anion Gap: 17 mmol/L (ref 10.0–18.0)
BUN/Creatinine Ratio: 10 (ref 9–20)
BUN: 7 mg/dL (ref 6–20)
Bilirubin Total: 0.9 mg/dL (ref 0.0–1.2)
CO2: 20 mmol/L (ref 20–29)
Calcium: 9.5 mg/dL (ref 8.7–10.2)
Chloride: 103 mmol/L (ref 96–106)
Creatinine, Ser: 0.7 mg/dL — ABNORMAL LOW (ref 0.76–1.27)
Globulin, Total: 2.8 g/dL (ref 1.5–4.5)
Glucose: 75 mg/dL (ref 65–99)
Potassium: 4.2 mmol/L (ref 3.5–5.2)
Sodium: 140 mmol/L (ref 134–144)
Total Protein: 7.1 g/dL (ref 6.0–8.5)
eGFR: 133 mL/min/{1.73_m2} (ref >59)

## 2021-02-02 LAB — HEPATITIS C ANTIBODY: Hep C Virus Ab: <0.1 s/co ratio (ref 0.0–0.9)

## 2021-02-02 LAB — VITAMIN B12: Vitamin B-12: 571 pg/mL (ref 232–1245)

## 2021-02-02 NOTE — Assessment & Plan Note (Signed)
Patient had hep C screening done in clinic and this came back normal. --Patient still qualifies for the HPV vaccine.

## 2021-02-02 NOTE — Assessment & Plan Note (Addendum)
Patient states he has had right-sided pain for more than 3 years. All previous work-up has been negative. Patient states the pain has been worse recently and any minor touch to his right side elicit pain. The pain is constantly there but is worse with touch and certain movements. He has had associated vomiting when he eats. He also endorsed loss of appetite but denies any nausea, diarrhea or constipation. He has tried NSAIDs with no relief. On exam, there is moderate tenderness to palpation of the RUQ and right flank but no point tenderness. CMP and CBC unremarkable. No transaminitis  Plan:  --GI referral for further evaluation of the liver

## 2021-02-02 NOTE — Assessment & Plan Note (Signed)
Patient has had 40 pound weight loss in the last 2 years unintentionally. Patient states this was due to his loss of appetite and vomiting every time he eats. Patient states there is something down his stomach that makes him want to vomiting every time he eats. He only eats once or twice every 2 days. He denies any nausea, difficulty swallowing, diarrhea or blood in stool but states he feels "drained" most of the time. CBC and CMP all normal. No lymphadenopathy on exam. Patient's presentation of unintentional weight, vomiting of solid foods and loss of appetite could be likely secondary to esophageal stricture, malignancy or his vegan diet.    Plan: --Barium swallow study --GI referral for possible endoscopy

## 2021-02-02 NOTE — Assessment & Plan Note (Signed)
Patient's PHQ-9 today is 13.  Patient reports he has a history of SI but has not had these thoughts in about a year.  States he does not think the thoughts will ever go away but he does not have any plans of ending his life.  I offered counseling to patient patient refused stating that he does not like to talk about it and does not want any medication.  Patient states she try to stay busy with work so is currently working 3 jobs which keeps him busy. --Continue to monitor closely with PHQ-9 --Continue to offer counseling

## 2021-02-02 NOTE — Assessment & Plan Note (Signed)
Patient has been on a vegan diet for the past 4 years.  B12 levels low at 192 a year ago.  Patient was started on B12 supplementation.  Current B12 levels was normal at 571.  Plan to continue B12 supplementation for now. --Continue B12 100 mcg daily --Patient can stop B12 supplementation once prescription runs out.

## 2021-02-02 NOTE — Assessment & Plan Note (Signed)
Patient states he has had cold intolerance for 2 years.  He was previously evaluated for anemia and thyroid dysfunction.  CBC and TSH were normal. --Consider repeating TSH at next office visit last check was 2 years ago.

## 2021-02-05 NOTE — Progress Notes (Signed)
Internal Medicine Clinic Attending  Case discussed with Dr. Amponsah  At the time of the visit.  We reviewed the resident's history and exam and pertinent patient test results.  I agree with the assessment, diagnosis, and plan of care documented in the resident's note.  

## 2021-02-15 ENCOUNTER — Encounter: Payer: Self-pay | Admitting: *Deleted

## 2021-03-02 ENCOUNTER — Encounter: Payer: Self-pay | Admitting: Gastroenterology

## 2021-03-02 ENCOUNTER — Ambulatory Visit (INDEPENDENT_AMBULATORY_CARE_PROVIDER_SITE_OTHER): Payer: Self-pay | Admitting: Gastroenterology

## 2021-03-02 VITALS — BP 112/74 | HR 63 | Ht 69.0 in | Wt 188.0 lb

## 2021-03-02 DIAGNOSIS — R630 Anorexia: Secondary | ICD-10-CM

## 2021-03-02 DIAGNOSIS — R112 Nausea with vomiting, unspecified: Secondary | ICD-10-CM

## 2021-03-02 DIAGNOSIS — R1011 Right upper quadrant pain: Secondary | ICD-10-CM

## 2021-03-02 MED ORDER — OMEPRAZOLE 40 MG PO CPDR: 40.0000 mg | DELAYED_RELEASE_CAPSULE | Freq: Every day | ORAL | 3 refills | Status: DC

## 2021-03-02 NOTE — Patient Instructions (Addendum)
You have been scheduled for an endoscopy. Please follow written instructions given to you at your visit today. If you use inhalers (even only as needed), please bring them with you on the day of your procedure.   You have been scheduled for an abdominal ultrasound at [facility] (1st floor of hospital) on 03/08/2021 at 10:30am . Please arrive 15 minutes prior to your appointment for registration. Make certain not to have anything to eat or drink 6 hours prior to your appointment. Should you need to reschedule your appointment, please contact radiology at [phone]. This test typically takes about 30 minutes to perform.  We have sent Omeprazole to your pharmacy  Due to recent changes in healthcare laws, you may see the results of your imaging and laboratory studies on MyChart before your provider has had a chance to review them.  We understand that in some cases there may be results that are confusing or concerning to you. Not all laboratory results come back in the same time frame and the provider may be waiting for multiple results in order to interpret others.  Please give [place] 48 hours in order for your provider to thoroughly review all the results before contacting the office for clarification of your results.   I appreciate the  opportunity to care for you  Thank You   Marsa Aris , MD

## 2021-03-02 NOTE — Progress Notes (Signed)
[place]    [id number]    [birth year]  Primary Care Physician:Amponsah, Flossie Buffy, MD  Referring Physician: Steffanie Rainwater, MD [address] [place],  [place] [address]   Chief complaint:  RUQ abd pain, GERD, Nausea  HPI: 25 year old male here for new patient visit with complaints of persistent right upper quadrant abdominal pain.  He has been experiencing pain/discomfort and burning sensation in the right upper quadrant for past 6 years, is constant. CT abdomen and pelvis with contrast December 01, 2015 was unremarkable with no acute pathology  He complains of nausea, decreased appetite and vomiting up to 3 times a day, he is only eating 1 meal per day.  Complains of weight loss over 100 pounds though based on chart review, he has not lost significant weight in the past 5 years.  His weight did fluctuate +/- 10-20 lbs  Denies any recent marijuana use or THC edibles, according to patient last marijuana use was a year ago  He underwent physical therapy with no improvement of the pain  He saw [facility] 5 to 6 years ago, does not recall having any work-up at the time for evaluation of right upper quadrant abdominal pain.  Never had EGD or colonoscopy No family history of IBD or GI malignancy.  He works in the kitchen at [employer]   Outpatient Encounter Medications as of 03/02/2021  Medication Sig  . vitamin B-12 (CYANOCOBALAMIN) 1000 MCG tablet Take 1 tablet (1,000 mcg total) by mouth daily.   No facility-administered encounter medications on file as of 03/02/2021.    Allergies as of 03/02/2021  . (No Known Allergies)    Past Medical History:  Diagnosis Date  . Right upper quadrant pain 10/18/2015  . Syncope 10/18/2015    Past Surgical History:  Procedure Laterality Date  . WISDOM TOOTH EXTRACTION      Family History  Problem Relation Age of Onset  . Stomach cancer Neg Hx   . Colon cancer Neg Hx   . Esophageal cancer Neg Hx   . Pancreatic  cancer Neg Hx   . Liver disease Neg Hx     Social History   Socioeconomic History  . Marital status: Single    Spouse name: Not on file  . Number of children: Not on file  . Years of education: Not on file  . Highest education level: Not on file  Occupational History  . Not on file  Tobacco Use  . Smoking status: Never Smoker  . Smokeless tobacco: Never Used  Vaping Use  . Vaping Use: Every day  Substance and Sexual Activity  . Alcohol use: Yes    Alcohol/week: 7.0 standard drinks    Types: 7 Standard drinks or equivalent per week  . Drug use: No  . Sexual activity: Not Currently  Other Topics Concern  . Not on file  Social History Narrative   Lives with mother. Has two brothers. 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    Social Determinants of Health   Financial Resource Strain: Not on file  Food Insecurity: Not on file  Transportation Needs: Not on file  Physical Activity: Not on file  Stress: Not on file  Social Connections: Not on file  Intimate Partner Violence: Not on file      Review of systems: All other review of systems negative except as mentioned in the HPI.   Physical  Exam: Vitals:   03/02/21 1332  BP: 112/74  Pulse: 63  SpO2: 99%   Body mass index is 27.76 kg/m. Gen:      No acute distress HEENT:  sclera anicteric Abd:      soft, non-tender, with voluntary guarding; no palpable masses, no distension Ext:    No edema Neuro: alert and oriented x 3 Psych: normal mood and affect  Data Reviewed:  Reviewed labs, radiology imaging, old records and pertinent past GI work up   Assessment and Plan/Recommendations 25 year old male with complaints of chronic right upper quadrant abdominal pain, nausea, vomiting, decreased appetite and weight loss Based on history, his symptoms are most likely functional but will need to exclude any GI pathology Schedule EGD to exclude gastroduodenitis, or gastroduodenal ulcer Obtain biopsies to  exclude H. Pylori  GERD: Use omeprazole 40 mg daily, 30 minutes before breakfast Antireflux measures  Obtain right upper quadrant abdominal ultrasound to exclude gallbladder disease or any acute liver pathology  If above work-up is unrevealing for any significant pathology, will consider referral to Dr. Ayesha Mohair or pain management specialist  The risks and benefits as well as alternatives of endoscopic procedure(s) have been discussed and reviewed. All questions answered. The patient agrees to proceed.    Iona Beard , MD    CC: Steffanie Rainwater, MD

## 2021-03-08 ENCOUNTER — Encounter: Payer: Self-pay | Admitting: Certified Registered Nurse Anesthetist

## 2021-03-08 ENCOUNTER — Ambulatory Visit (HOSPITAL_COMMUNITY): Payer: Self-pay

## 2021-03-09 ENCOUNTER — Ambulatory Visit (AMBULATORY_SURGERY_CENTER): Payer: Self-pay | Admitting: Gastroenterology

## 2021-03-09 ENCOUNTER — Other Ambulatory Visit: Payer: Self-pay

## 2021-03-09 ENCOUNTER — Encounter: Payer: Self-pay | Admitting: Gastroenterology

## 2021-03-09 VITALS — BP 98/77 | HR 52 | Temp 98.2°F | Resp 14 | Ht 69.0 in | Wt 188.0 lb

## 2021-03-09 DIAGNOSIS — R112 Nausea with vomiting, unspecified: Secondary | ICD-10-CM

## 2021-03-09 DIAGNOSIS — R1010 Upper abdominal pain, unspecified: Secondary | ICD-10-CM

## 2021-03-09 DIAGNOSIS — K2950 Unspecified chronic gastritis without bleeding: Secondary | ICD-10-CM

## 2021-03-09 DIAGNOSIS — K2970 Gastritis, unspecified, without bleeding: Secondary | ICD-10-CM

## 2021-03-09 MED ORDER — SODIUM CHLORIDE 0.9 % IV SOLN: 500.0000 mL | Freq: Once | INTRAVENOUS | Status: DC

## 2021-03-09 MED ORDER — SODIUM CHLORIDE 0.9 % IV SOLN
4.0000 mg | Freq: Once | INTRAVENOUS | Status: AC
  Administered 2021-03-09: 4 mg via INTRAVENOUS

## 2021-03-09 MED ORDER — ONDANSETRON HCL 4 MG/5ML PO SOLN: 4.0000 mg | Freq: Once | ORAL | Status: DC

## 2021-03-09 MED ORDER — SODIUM CHLORIDE 0.9 % IV SOLN: 4.0000 mg | Freq: Once | INTRAVENOUS | Status: DC

## 2021-03-09 NOTE — Progress Notes (Signed)
1054 Robinul 0.1 mg IV given due large amount of secretions upon assessment.  MD made aware, vss  °

## 2021-03-09 NOTE — Patient Instructions (Signed)
Read all of the handouts given to you by your recovery room nurse. ° °YOU HAD AN ENDOSCOPIC PROCEDURE TODAY AT THE [facility]:   Refer to the procedure report that was given to you for any specific questions about what was found during the examination.  If the procedure report does not answer your questions, please call your gastroenterologist to clarify.  If you requested that your care partner not be given the details of your procedure findings, then the procedure report has been included in a sealed envelope for you to review at your convenience later. ° °YOU SHOULD EXPECT: Some feelings of bloating in the abdomen. Passage of more gas than usual.  Walking can help get rid of the air that was put into your GI tract during the procedure and reduce the bloating.  ° °Please Note:  You might notice some irritation and congestion in your nose or some drainage.  This is from the oxygen used during your procedure.  There is no need for concern and it should clear up in a day or so. ° °SYMPTOMS TO REPORT IMMEDIATELY: ° ° °Following upper endoscopy (EGD) ° Vomiting of blood or coffee ground material ° New chest pain or pain under the shoulder blades ° Painful or persistently difficult swallowing ° New shortness of breath ° Fever of 100°F or higher ° Black, tarry-looking stools ° °For urgent or emergent issues, a gastroenterologist can be reached at any hour by calling [phone]. °Do not use MyChart messaging for urgent concerns.  ° ° °DIET:  We do recommend a small meal at first, but then you may proceed to your regular diet.  Drink plenty of fluids but you should avoid alcoholic beverages for 24 hours. ° °ACTIVITY:  You should plan to take it easy for the rest of today and you should NOT DRIVE or use heavy machinery until tomorrow (because of the sedation medicines used during the test).   ° °FOLLOW UP: °Our staff will call the number listed on your records 48-72 hours following your procedure to check  on you and address any questions or concerns that you may have regarding the information given to you following your procedure. If we do not reach you, we will leave a message.  We will attempt to reach you two times.  During this call, we will ask if you have developed any symptoms of COVID 19. If you develop any symptoms (ie: fever, flu-like symptoms, shortness of breath, cough etc.) before then, please call [phone].  If you test positive for Covid 19 in the 2 weeks post procedure, please call and report this information to us.   ° °If any biopsies were taken you will be contacted by phone or by letter within the next 1-3 weeks.  Please call us at [phone] if you have not heard about the biopsies in 3 weeks.  ° ° °SIGNATURES/CONFIDENTIALITY: °You and/or your care partner have signed paperwork which will be entered into your electronic medical record.  These signatures attest to the fact that that the information above on your After Visit Summary has been reviewed and is understood.  Full responsibility of the confidentiality of this discharge information lies with you and/or your care-partner.  °

## 2021-03-09 NOTE — Progress Notes (Signed)
Report given to PACU, vss 

## 2021-03-09 NOTE — Op Note (Signed)
[facility] Patient Name: [place] Procedure Date: 03/09/2021 10:53 AM MRN: [id number] Endoscopist: Viviann SpareSteven P. Adela LankArmbruster , MD Age: 25 Referring MD:  Date of Birth: 1996-09-24 Gender: Male Account #: [id number] Procedure:                Upper GI endoscopy Indications:              Abdominal pain in the right upper quadrant, Nausea                            with vomiting - patient has not yet tried                            omeprazole as prescribed Medicines:                Monitored Anesthesia Care Procedure:                Pre-Anesthesia Assessment:                           - Prior to the procedure, a History and Physical                            was performed, and patient medications and                            allergies were reviewed. The patient's tolerance of                            previous anesthesia was also reviewed. The risks                            and benefits of the procedure and the sedation                            options and risks were discussed with the patient.                            All questions were answered, and informed consent                            was obtained. Prior Anticoagulants: The patient has                            taken no previous anticoagulant or antiplatelet                            agents. ASA Grade Assessment: II - A patient with                            mild systemic disease. After reviewing the risks                            and benefits, the patient was deemed in  satisfactory condition to undergo the procedure.                           After obtaining informed consent, the endoscope was                            passed under direct vision. Throughout the                            procedure, the patient's blood pressure, pulse, and                            oxygen saturations were monitored continuously. The                            Endoscope was introduced through  the mouth, and                            advanced to the second part of duodenum. The upper                            GI endoscopy was accomplished without difficulty.                            The patient tolerated the procedure well. Scope In: Scope Out: Findings:                 Esophagogastric landmarks were identified: the                            Z-line was found at 41 cm, the gastroesophageal                            junction was found at 41 cm and the upper extent of                            the gastric folds was found at 41 cm from the                            incisors.                           The exam of the esophagus was otherwise normal.                           The entire examined stomach was normal. Biopsies                            were taken with a cold forceps for Helicobacter                            pylori testing.                           The duodenal bulb  and second portion of the                            duodenum were normal. Complications:            No immediate complications. Estimated blood loss:                            Minimal. Estimated Blood Loss:     Estimated blood loss was minimal. Impression:               - Esophagogastric landmarks identified.                           - Normal esophagus.                           - Normal stomach. Biopsied.                           - Normal duodenal bulb and second portion of the                            duodenum.                           No overt cause / abnormalities to account for the                            patient's symptoms on this exam. Will await biopsy                            results, and RUQ ultrasound which is pending. Recommendation:           - Patient has a contact number available for                            emergencies. The signs and symptoms of potential                            delayed complications were discussed with the                            patient.  Return to normal activities tomorrow.                            Written discharge instructions were provided to the                            patient.                           - Resume previous diet.                           - Continue present medications.                           -  Trial of omeprazole as previously recommended (in                            case component of dyspepsia)                           - Await pathology results.                           - Await RUQ ultrasound Tyee Vandevoorde P. Bambie Pizzolato, MD 03/09/2021 11:15:59 AM This report has been signed electronically.

## 2021-03-09 NOTE — Progress Notes (Signed)
Called to room to assist during endoscopic procedure.  Patient ID and intended procedure confirmed with present staff. Received instructions for my participation in the procedure from the performing physician.  

## 2021-03-09 NOTE — Progress Notes (Signed)
Patient states #6 r sided under rib cage and nausea.  States that's why he came here. Currently nauseated, but states again that "it's nothing new."  I told the patient that we have few narcotics here.  He stated that he did not want to go to the ED. Patient also refused crackers to help settle stomach.  Dr will be in after his next case.

## 2021-03-13 ENCOUNTER — Telehealth: Payer: Self-pay

## 2021-03-13 NOTE — Telephone Encounter (Signed)
  Follow up Call-  Call back number 03/09/2021  Post procedure Call Back phone  # [phone]  Permission to leave phone message Yes  Some recent data might be hidden     Patient questions:  Do you have a fever, pain , or abdominal swelling? No. Pain Score  0 *  Have you tolerated food without any problems? Yes.    Have you been able to return to your normal activities? Yes.    Do you have any questions about your discharge instructions: Diet   No. Medications  No. Follow up visit  No.  Do you have questions or concerns about your Care? No.  Actions: * If pain score is 4 or above: No action needed, pain <4.  1. Have you developed a fever since your procedure? no  2.   Have you had an respiratory symptoms (SOB or cough) since your procedure? no  3.   Have you tested positive for COVID 19 since your procedure no  4.   Have you had any family members/close contacts diagnosed with the COVID 19 since your procedure?  no   If yes to any of these questions please route to Laverna Peace, RN and Karlton Lemon, RN

## 2021-03-16 NOTE — Progress Notes (Signed)
Reviewed and agree with documentation and assessment and plan. K. Veena Honor Frison , MD   

## 2021-03-19 ENCOUNTER — Ambulatory Visit (HOSPITAL_COMMUNITY)
Admission: RE | Admit: 2021-03-19 | Discharge: 2021-03-19 | Disposition: A | Discharge status: Home / Self Care | Payer: Self-pay | Source: Ambulatory Visit | Attending: Gastroenterology | Admitting: Gastroenterology

## 2021-03-19 ENCOUNTER — Other Ambulatory Visit: Payer: Self-pay

## 2021-03-19 DIAGNOSIS — R1011 Right upper quadrant pain: Secondary | ICD-10-CM | POA: Insufficient documentation

## 2021-03-19 DIAGNOSIS — R630 Anorexia: Secondary | ICD-10-CM | POA: Insufficient documentation

## 2021-03-19 DIAGNOSIS — R112 Nausea with vomiting, unspecified: Secondary | ICD-10-CM | POA: Insufficient documentation

## 2021-05-01 ENCOUNTER — Encounter: Payer: Self-pay | Admitting: *Deleted

## 2022-08-28 ENCOUNTER — Ambulatory Visit (INDEPENDENT_AMBULATORY_CARE_PROVIDER_SITE_OTHER): Payer: Self-pay | Admitting: Family Medicine

## 2022-08-28 ENCOUNTER — Encounter (HOSPITAL_BASED_OUTPATIENT_CLINIC_OR_DEPARTMENT_OTHER): Payer: Self-pay | Admitting: Family Medicine

## 2022-08-28 DIAGNOSIS — Z0000 Encounter for general adult medical examination without abnormal findings: Secondary | ICD-10-CM

## 2022-08-28 DIAGNOSIS — E538 Deficiency of other specified B group vitamins: Secondary | ICD-10-CM

## 2022-08-28 NOTE — Assessment & Plan Note (Signed)
Noted on history.  Has not been taking vitamin B12 supplement for about one year.  We will check vitamin B12 levels with upcoming labs to assess current status

## 2022-08-28 NOTE — Patient Instructions (Signed)
  Medication Instructions:  Your physician recommends that you continue on your current medications as directed. Please refer to the Current Medication list given to you today. --If you need a refill on any your medications before your next appointment, please call your pharmacy first. If no refills are authorized on file call the office.-- Lab Work: Your physician has recommended that you have lab work today: No If you have labs (blood work) drawn today and your tests are completely normal, you will receive your results via [place] a phone call from our staff.  Please ensure you check your voicemail in the event that you authorized detailed messages to be left on a delegated number. If you have any lab test that is abnormal or we need to change your treatment, we will call you to review the results.  Referrals/Procedures/Imaging: No  Follow-Up: Your next appointment:   Your physician recommends that you schedule a follow-up appointment in: 4-6 weeks cpe with Dr. de [place]. Nurse visit for labs a few days before.   You will receive a text message or e-mail with a link to a survey about your care and experience with [place] today! We would greatly appreciate your feedback!   Thanks for letting us be apart of your health journey!!  Primary Care and Sports Medicine   Dr. Arlina Robes [place]   We encourage you to activate your patient portal called "MyChart".  Sign up information is provided on this After Visit Summary.  MyChart is used to connect with patients for Virtual Visits (Telemedicine).  Patients are able to view lab/test results, encounter notes, upcoming appointments, etc.  Non-urgent messages can be sent to your provider as well. To learn more about what you can do with MyChart, please visit --  [email or web address].

## 2022-08-28 NOTE — Progress Notes (Signed)
New Patient Office Visit  Subjective    Patient ID: [place], male    DOB: 1996-07-14  Age: 26 y.o. MRN: [id number]  CC:  Chief Complaint  Patient presents with   New Patient (Initial Visit)    Pt here to establish new care     HPI [place] presents to establish care Last PCP - with [facility]  Denies any chronic medical issues, no regular medications at this time. Did take B12 in the past at one point. Denies any prior surgeries or hospitalizations.  Vitamin B12 deficiency: Patient did have issues with vitamin B12 in the past and was taking vitamin B12 supplement at 1 point.  His most recent B12 level was normal a little over 1 year ago.  He has not been taking his B12 supplement now for about 1 year.  Reports that mom has diabetes, no other family history reported.  Patient is originally from [place]. Patient works in a nursing home, [employer]. Outside of work, he enjoys working.  Outpatient Encounter Medications as of 08/28/2022  Medication Sig   [DISCONTINUED] omeprazole (PRILOSEC) 40 MG capsule Take 1 capsule (40 mg total) by mouth daily. (Patient not taking: Reported on 03/09/2021)   [DISCONTINUED] vitamin B-12 (CYANOCOBALAMIN) 1000 MCG tablet Take 1 tablet (1,000 mcg total) by mouth daily.   [DISCONTINUED] 0.9 %  sodium chloride infusion    No facility-administered encounter medications on file as of 08/28/2022.    Past Medical History:  Diagnosis Date   Asthma    as a child   Depression    Right upper quadrant pain 10/18/2015   Syncope 10/18/2015   related to stress    Past Surgical History:  Procedure Laterality Date   WISDOM TOOTH EXTRACTION      Family History  Problem Relation Age of Onset   Stomach cancer Neg Hx    Colon cancer Neg Hx    Esophageal cancer Neg Hx    Pancreatic cancer Neg Hx    Liver disease Neg Hx     Social History   Socioeconomic History   Marital status: Single    Spouse name: Not  on file   Number of children: Not on file   Years of education: Not on file   Highest education level: Not on file  Occupational History   Not on file  Tobacco Use   Smoking status: Never   Smokeless tobacco: Never  Vaping Use   Vaping Use: Every day  Substance and Sexual Activity   Alcohol use: Yes    Alcohol/week: 14.0 standard drinks of alcohol    Types: 14 Standard drinks or equivalent per week   Drug use: No   Sexual activity: Not Currently  Other Topics Concern   Not on file  Social History Narrative   Lives with mother. Has two brothers. 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    Social Determinants of Health   Financial Resource Strain: Not on file  Food Insecurity: Not on file  Transportation Needs: Not on file  Physical Activity: Not on file  Stress: Not on file  Social Connections: Not on file  Intimate Partner Violence: Not on file    Objective    BP 112/84   Pulse (!) 54   Temp 97.8 F (36.6 C) (Oral)   Ht 5\' 9"  (1.753 m)   Wt 225 lb 1.6 oz (102.1 kg)   SpO2 100%  BMI 33.24 kg/m   Physical Exam  26 year old male in no acute distress Cardiovascular exam with regular rate and rhythm, no murmur appreciated Lungs clear to auscultation bilaterally  Assessment & Plan:   Problem List Items Addressed This Visit       Other   Deficiency of vitamin B12    Noted on history.  Has not been taking vitamin B12 supplement for about one year.  We will check vitamin B12 levels with upcoming labs to assess current status      Relevant Orders   Vitamin B12   Other Visit Diagnoses     Wellness examination       Relevant Orders   CBC with Differential/Platelet   Hemoglobin A1c   Comprehensive metabolic panel   Lipid panel   TSH Rfx on Abnormal to Free T4   Vitamin B12       Return in about 4 weeks (around 09/25/2022) for CPE with [facility] a few days prior.   [place] J De [place], MD

## 2022-09-25 ENCOUNTER — Ambulatory Visit (HOSPITAL_BASED_OUTPATIENT_CLINIC_OR_DEPARTMENT_OTHER): Payer: BC Managed Care – PPO

## 2022-09-25 DIAGNOSIS — Z0000 Encounter for general adult medical examination without abnormal findings: Secondary | ICD-10-CM | POA: Diagnosis not present

## 2022-09-25 DIAGNOSIS — E538 Deficiency of other specified B group vitamins: Secondary | ICD-10-CM

## 2022-09-26 LAB — COMPREHENSIVE METABOLIC PANEL
ALT: 12 IU/L (ref 0–44)
AST: 16 IU/L (ref 0–40)
Albumin/Globulin Ratio: 1.7 (ref 1.2–2.2)
Albumin: 4.3 g/dL (ref 4.3–5.2)
Alkaline Phosphatase: 47 IU/L (ref 44–121)
BUN/Creatinine Ratio: 14 (ref 9–20)
BUN: 11 mg/dL (ref 6–20)
Bilirubin Total: 0.6 mg/dL (ref 0.0–1.2)
CO2: 23 mmol/L (ref 20–29)
Calcium: 9.6 mg/dL (ref 8.7–10.2)
Chloride: 105 mmol/L (ref 96–106)
Creatinine, Ser: 0.8 mg/dL (ref 0.76–1.27)
Globulin, Total: 2.5 g/dL (ref 1.5–4.5)
Glucose: 92 mg/dL (ref 70–99)
Potassium: 4 mmol/L (ref 3.5–5.2)
Sodium: 142 mmol/L (ref 134–144)
Total Protein: 6.8 g/dL (ref 6.0–8.5)
eGFR: 126 mL/min/{1.73_m2} (ref >59)

## 2022-09-26 LAB — CBC WITH DIFFERENTIAL/PLATELET
Basophils Absolute: 0 10*3/uL (ref 0.0–0.2)
Basos: 0 %
EOS (ABSOLUTE): 0.1 10*3/uL (ref 0.0–0.4)
Eos: 1 %
Hematocrit: 44 % (ref 37.5–51.0)
Hemoglobin: 14.7 g/dL (ref 13.0–17.7)
Immature Grans (Abs): 0 10*3/uL (ref 0.0–0.1)
Immature Granulocytes: 0 %
Lymphocytes Absolute: 1.7 10*3/uL (ref 0.7–3.1)
Lymphs: 31 %
MCH: 30.1 pg (ref 26.6–33.0)
MCHC: 33.4 g/dL (ref 31.5–35.7)
MCV: 90 fL (ref 79–97)
Monocytes Absolute: 0.4 10*3/uL (ref 0.1–0.9)
Monocytes: 8 %
Neutrophils Absolute: 3.1 10*3/uL (ref 1.4–7.0)
Neutrophils: 60 %
Platelets: 185 10*3/uL (ref 150–450)
RBC: 4.89 x10E6/uL (ref 4.14–5.80)
RDW: 13.2 % (ref 11.6–15.4)
WBC: 5.3 10*3/uL (ref 3.4–10.8)

## 2022-09-26 LAB — VITAMIN B12: Vitamin B-12: 377 pg/mL (ref 232–1245)

## 2022-09-26 LAB — HEMOGLOBIN A1C
Est. average glucose Bld gHb Est-mCnc: 103 mg/dL
Hgb A1c MFr Bld: 5.2 % (ref 4.8–5.6)

## 2022-09-26 LAB — LIPID PANEL
Chol/HDL Ratio: 2.5 ratio (ref 0.0–5.0)
Cholesterol, Total: 141 mg/dL (ref 100–199)
HDL: 57 mg/dL (ref >39)
LDL Chol Calc (NIH): 71 mg/dL (ref 0–99)
Triglycerides: 62 mg/dL (ref 0–149)
VLDL Cholesterol Cal: 13 mg/dL (ref 5–40)

## 2022-09-26 LAB — TSH RFX ON ABNORMAL TO FREE T4: TSH: 0.646 u[IU]/mL (ref 0.450–4.500)

## 2022-10-02 ENCOUNTER — Encounter (HOSPITAL_BASED_OUTPATIENT_CLINIC_OR_DEPARTMENT_OTHER): Payer: Self-pay | Admitting: Family Medicine

## 2022-10-16 ENCOUNTER — Ambulatory Visit (INDEPENDENT_AMBULATORY_CARE_PROVIDER_SITE_OTHER): Payer: BC Managed Care – PPO | Admitting: Family Medicine

## 2022-10-16 ENCOUNTER — Encounter (HOSPITAL_BASED_OUTPATIENT_CLINIC_OR_DEPARTMENT_OTHER): Payer: Self-pay | Admitting: Family Medicine

## 2022-10-16 VITALS — BP 129/77 | HR 52 | Temp 97.8°F | Ht 69.0 in | Wt 214.1 lb

## 2022-10-16 DIAGNOSIS — Z0000 Encounter for general adult medical examination without abnormal findings: Secondary | ICD-10-CM

## 2022-10-16 MED ORDER — TRIAMCINOLONE ACETONIDE 0.1 % EX CREA: 1.0000 | TOPICAL_CREAM | Freq: Two times a day (BID) | CUTANEOUS | 0 refills | Status: AC

## 2022-10-16 NOTE — Assessment & Plan Note (Addendum)
Routine HCM labs reviewed. HCM reviewed/discussed. Anticipatory guidance regarding healthy weight, lifestyle and choices given. Recommend healthy diet.  Recommend approximately 150 minutes/week of moderate intensity exercise Recommend regular dental and vision exams Always use seatbelt/lap and shoulder restraints Recommend using smoke alarms and checking batteries at least twice a year Recommend using sunscreen when outside Discussed tetanus immunization recommendations, patient is UTD 

## 2022-10-16 NOTE — Progress Notes (Signed)
Subjective:    CC: Annual Physical Exam  HPI:  Billy Turner is a 26 y.o. presenting for annual physical  I reviewed the past medical history, family history, social history, surgical history, and allergies today and no changes were needed.  Please see the problem list section below in epic for further details.  Past Medical History: Past Medical History:  Diagnosis Date   Asthma    as a child   Depression    Right upper quadrant pain 10/18/2015   Syncope 10/18/2015   related to stress   Past Surgical History: Past Surgical History:  Procedure Laterality Date   WISDOM TOOTH EXTRACTION     Social History: Social History   Socioeconomic History   Marital status: Single    Spouse name: Not on file   Number of children: Not on file   Years of education: Not on file   Highest education level: Not on file  Occupational History   Not on file  Tobacco Use   Smoking status: Never   Smokeless tobacco: Never  Vaping Use   Vaping Use: Every day  Substance and Sexual Activity   Alcohol use: Yes    Alcohol/week: 14.0 standard drinks of alcohol    Types: 14 Standard drinks or equivalent per week   Drug use: No   Sexual activity: Not Currently  Other Topics Concern   Not on file  Social History Narrative   Lives with mother. Has two brothers. 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    Social Determinants of Health   Financial Resource Strain: Not on file  Food Insecurity: Not on file  Transportation Needs: Not on file  Physical Activity: Not on file  Stress: Not on file  Social Connections: Not on file   Family History: Family History  Problem Relation Age of Onset   Stomach cancer Neg Hx    Colon cancer Neg Hx    Esophageal cancer Neg Hx    Pancreatic cancer Neg Hx    Liver disease Neg Hx    Allergies: No Known Allergies Medications: See med rec.  Review of Systems: No headache, visual changes, nausea, vomiting, diarrhea, constipation,  dizziness, abdominal pain, skin rash, fevers, chills, night sweats, swollen lymph nodes, weight loss, chest pain, body aches, joint swelling, muscle aches, shortness of breath, mood changes, visual or auditory hallucinations.  Objective:    BP 129/77 (BP Location: Right Arm, Patient Position: Sitting, Cuff Size: Large)   Pulse (!) 52   Temp 97.8 F (36.6 C) (Oral)   Ht 5\' 9"  (1.753 m)   Wt 214 lb 1.6 oz (97.1 kg)   SpO2 100%   BMI 31.62 kg/m   General: Well Developed, well nourished, and in no acute distress. Neuro: Alert and oriented x3, extra-ocular muscles intact, sensation grossly intact. Cranial nerves II through XII are intact, motor, sensory, and coordinative functions are all intact. HEENT: Normocephalic, atraumatic, pupils equal round reactive to light, neck supple, no masses, no lymphadenopathy, thyroid nonpalpable. Oropharynx, nasopharynx, external ear canals are unremarkable. Skin: Warm and dry, no rashes noted. Cardiac: Regular rate and rhythm, no murmurs rubs or gallops. Respiratory: Clear to auscultation bilaterally. Not using accessory muscles, speaking in full sentences. Abdominal: Soft, nontender, nondistended, positive bowel sounds, no masses, no organomegaly. Musculoskeletal: Shoulder, elbow, wrist, hip, knee, ankle stable, and with full range of motion.  Impression and Recommendations:    Wellness examination Routine HCM labs reviewed. HCM reviewed/discussed. Anticipatory guidance regarding  healthy weight, lifestyle and choices given. Recommend healthy diet.  Recommend approximately 150 minutes/week of moderate intensity exercise Recommend regular dental and vision exams Always use seatbelt/lap and shoulder restraints Recommend using smoke alarms and checking batteries at least twice a year Recommend using sunscreen when outside Discussed tetanus immunization recommendations, patient is UTD  Patient does have history of depression as well as suicidal ideation  with some attempts in the past.  He currently does have some increased symptoms of depression as well as anxiety based on screening questionnaire today.  He additionally endorses some intermittent thoughts of suicidal ideation, no current plan.  Previously, reports that he has attempted cutting, working on bridges/wedges, driving at high speeds while it was raining.  We discussed options today including medication, counseling, therapy, or acute evaluation in emergency department/behavioral health center.  He declines all of these today.  Reports that he has been on medication appointment past, however does not like taking daily medication.  Discussed potential considerations related to referral for counseling/therapy, not interested at this time.  Discussed resources available for more acute/emergent evaluation should patient have any worsening of symptoms or thoughts of suicidal ideation, patient voices understanding  He indicates having mild rash over right hip/lower abdomen which has been present for a few weeks now.  He has been using OTC hydrocortisone with some relief in regards to itching he has been experiencing.  He reports having history of eczema and feels that rash/symptoms are similar to prior issues.  Area with mild hyperpigmentation, mild erythema.  We discussed options.  We will send prescription for slightly more potent topical corticosteroid for patient to utilize, instructed on using for 1 to 4 weeks until symptoms improved.  If symptoms do worsen with steroid or there is incomplete resolution, discussed possibility that could be related to fungal etiology.  If symptoms are persistent or worsening, recommend reaching out to [place] and we can send a prescription for topical antifungal to be utilized instead of topical corticosteroid  Return in about 6 months (around 04/17/2023).   ___________________________________________ Kayshawn Ozburn de [place], MD, ABFM, CAQSM Primary Care and Sports Medicine [facility]

## 2023-02-19 ENCOUNTER — Ambulatory Visit (HOSPITAL_BASED_OUTPATIENT_CLINIC_OR_DEPARTMENT_OTHER): Payer: BC Managed Care – PPO | Admitting: Family Medicine
